# Patient Record
Sex: Male | Born: 1995 | Race: White | Hispanic: No | Marital: Single
Health system: Southern US, Community
[De-identification: ages and names within clinical notes are randomized; demographics above are authoritative.]

## PROBLEM LIST (undated history)

## (undated) DIAGNOSIS — R569 Unspecified convulsions: Secondary | ICD-10-CM

## (undated) DIAGNOSIS — F909 Attention-deficit hyperactivity disorder, unspecified type: Secondary | ICD-10-CM

## (undated) DIAGNOSIS — F319 Bipolar disorder, unspecified: Secondary | ICD-10-CM

## (undated) HISTORY — PX: APPENDECTOMY: SHX54

## (undated) HISTORY — PX: HERNIA REPAIR: SHX51

---
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
## 2014-10-14 ENCOUNTER — Emergency Department (HOSPITAL_COMMUNITY)
Admission: EM | Admit: 2014-10-14 | Discharge: 2014-10-14 | Disposition: A | Discharge status: Home / Self Care | Payer: Self-pay | Attending: Emergency Medicine | Admitting: Emergency Medicine

## 2014-10-14 ENCOUNTER — Encounter (HOSPITAL_COMMUNITY): Payer: Self-pay | Admitting: Emergency Medicine

## 2014-10-14 DIAGNOSIS — K0889 Other specified disorders of teeth and supporting structures: Secondary | ICD-10-CM

## 2014-10-14 DIAGNOSIS — K088 Other specified disorders of teeth and supporting structures: Secondary | ICD-10-CM | POA: Insufficient documentation

## 2014-10-14 DIAGNOSIS — K029 Dental caries, unspecified: Secondary | ICD-10-CM | POA: Insufficient documentation

## 2014-10-14 DIAGNOSIS — Z792 Long term (current) use of antibiotics: Secondary | ICD-10-CM | POA: Insufficient documentation

## 2014-10-14 DIAGNOSIS — Z720 Tobacco use: Secondary | ICD-10-CM | POA: Insufficient documentation

## 2014-10-14 DIAGNOSIS — Z8659 Personal history of other mental and behavioral disorders: Secondary | ICD-10-CM | POA: Insufficient documentation

## 2014-10-14 DIAGNOSIS — K0381 Cracked tooth: Secondary | ICD-10-CM | POA: Insufficient documentation

## 2014-10-14 HISTORY — DX: Bipolar disorder, unspecified: F31.9

## 2014-10-14 HISTORY — DX: Attention-deficit hyperactivity disorder, unspecified type: F90.9

## 2014-10-14 MED ORDER — PENICILLIN V POTASSIUM 250 MG PO TABS: 250.0000 mg | ORAL_TABLET | Freq: Four times a day (QID) | ORAL | Status: AC

## 2014-10-14 MED ORDER — IBUPROFEN 800 MG PO TABS: 800.0000 mg | ORAL_TABLET | Freq: Three times a day (TID) | ORAL | Status: DC

## 2014-10-14 NOTE — ED Provider Notes (Signed)
CSN: [id number]     Arrival date & time 10/14/14  1106 History  This chart was scribed for non-physician practitioner, Aaron LowerVrinda Bailen Holder, Aaron Holder, working with Aaron OharaJoshua Zavitz, MD by Aaron Holder, ED Scribe. This patient was seen in room TR05C/TR05C and the patient's care was started at 11:30 AM.   Chief Complaint  Patient presents with  . Dental Pain   The history is provided by the patient. No language interpreter was used.   HPI Comments: Aaron Holder is a 19 y.o. male, with a h/o bipolar disorder, who presents to the Emergency Department complaining of constant R upper and Holder dental pain for the past 2 weeks. Pt states that he has a dental [place] in [place] in 3 weeks. No other complaints at this time.   Past Medical History  Diagnosis Date  . Bipolar 1 disorder   . ADHD (attention deficit hyperactivity disorder)    Past Surgical History  Procedure Laterality Date  . Appendectomy    . Hernia repair     No family history on file. History  Substance Use Topics  . Smoking status: Current Every Day Smoker  . Smokeless tobacco: Not on file  . Alcohol Use: No    Review of Systems  HENT: Positive for dental problem.    Allergies  Review of patient's allergies indicates no known allergies.  Home Medications   Prior to Admission medications   Medication Sig Start Date End Date Taking? Authorizing Provider  HYDROcodone-acetaminophen (NORCO) 5-325 MG per tablet Take 1 tablet by mouth every 4 (four) hours as needed for pain. 11/04/12   Aaron AndrewPeter Dammen, PA-C  penicillin v potassium (VEETID) 500 MG tablet Take 1 tablet (500 mg total) by mouth 3 (three) times daily. 11/04/12   Aaron Dammen, PA-C   BP 131/50 mmHg  Pulse 65  Temp(Src) 97.9 F (36.6 C) (Oral)  SpO2 100% Physical Exam  Constitutional: He is oriented to person, place, and time. He appears well-developed and well-nourished. No distress.  HENT:  Head: Normocephalic and atraumatic.  Mouth/Throat:    Decayed and cracked  teeth  Eyes: Conjunctivae and EOM are normal.  Neck: Neck supple. No tracheal deviation present.  Cardiovascular: Normal rate.   Pulmonary/Chest: Effort normal. No respiratory distress.  Musculoskeletal: Normal range of motion.  Neurological: He is alert and oriented to person, place, and time.  Skin: Skin is warm and dry.  Psychiatric: He has a normal mood and affect. His behavior is normal.  Nursing note and vitals reviewed.   ED Course  Procedures (including critical care time) DIAGNOSTIC STUDIES: Oxygen Saturation is 100% on RA, normal by my interpretation.    COORDINATION OF CARE: 11:31 AM-Discussed treatment plan which includes Veetid, ibuprofen and follow-up with dentist with pt at bedside and pt agreed to plan.   Labs Review Labs Reviewed - No data to display  Imaging Review No results found.   EKG Interpretation None      MDM   Final diagnoses:  Toothache    Given pcn and ibuprofen. No oral swelling  I personally performed the services described in this documentation, which was scribed in my presence. The recorded information has been reviewed and is accurate.    Aaron Holder, Aaron Holder 10/14/14 1136  Aaron OharaJoshua Zavitz, MD 10/14/14 [phone]

## 2014-10-14 NOTE — Discharge Instructions (Signed)

## 2014-10-14 NOTE — ED Notes (Signed)
Pt c/o dental pain x 3 weeks. States has appointment with dentist in [place] next month. Here today for pain.

## 2015-07-17 ENCOUNTER — Emergency Department (HOSPITAL_COMMUNITY)
Admission: EM | Admit: 2015-07-17 | Discharge: 2015-07-17 | Discharge status: Absent without leave | Payer: Self-pay | Source: Home / Self Care

## 2016-02-23 ENCOUNTER — Emergency Department (HOSPITAL_COMMUNITY)
Admission: EM | Admit: 2016-02-23 | Discharge: 2016-02-23 | Disposition: A | Discharge status: Home / Self Care | Payer: Self-pay | Attending: Emergency Medicine | Admitting: Emergency Medicine

## 2016-02-23 ENCOUNTER — Encounter (HOSPITAL_COMMUNITY): Payer: Self-pay | Admitting: *Deleted

## 2016-02-23 DIAGNOSIS — Y92240 Courthouse as the place of occurrence of the external cause: Secondary | ICD-10-CM | POA: Insufficient documentation

## 2016-02-23 DIAGNOSIS — Y999 Unspecified external cause status: Secondary | ICD-10-CM | POA: Insufficient documentation

## 2016-02-23 DIAGNOSIS — S93402A Sprain of unspecified ligament of left ankle, initial encounter: Secondary | ICD-10-CM | POA: Insufficient documentation

## 2016-02-23 DIAGNOSIS — Z79899 Other long term (current) drug therapy: Secondary | ICD-10-CM | POA: Insufficient documentation

## 2016-02-23 DIAGNOSIS — Y9301 Activity, walking, marching and hiking: Secondary | ICD-10-CM | POA: Insufficient documentation

## 2016-02-23 DIAGNOSIS — F17200 Nicotine dependence, unspecified, uncomplicated: Secondary | ICD-10-CM | POA: Insufficient documentation

## 2016-02-23 DIAGNOSIS — W19XXXA Unspecified fall, initial encounter: Secondary | ICD-10-CM | POA: Insufficient documentation

## 2016-02-23 MED ORDER — IBUPROFEN 800 MG PO TABS
800.0000 mg | ORAL_TABLET | Freq: Once | ORAL | Status: AC
  Administered 2016-02-23: 800 mg via ORAL
  Filled 2016-02-23: qty 1

## 2016-02-23 MED ORDER — IBUPROFEN 800 MG PO TABS: 800.0000 mg | ORAL_TABLET | Freq: Three times a day (TID) | ORAL | 0 refills | Status: AC

## 2016-02-23 NOTE — Progress Notes (Signed)
Orthopedic Tech Progress Note Patient Details:  Aaron Holder 11-16-1995 [id number]  Ortho Devices Type of Ortho Device: ASO Ortho Device/Splint Location: lle Ortho Device/Splint Interventions: Application   [name] 02/23/2016, 4:45 PM

## 2016-02-23 NOTE — ED Triage Notes (Signed)
Pt reports left leg swelling for over the past year. Reports increase in pain and redness. Had fall yesterday due to pain. No acute distress noted at triage.

## 2016-02-23 NOTE — ED Provider Notes (Signed)
[facility]EM[facility]ENCY DEPT Provider Note   CSN: [id number] Arrival date & time: 02/23/16  1132  First Provider Contact:  First MD Initiated Contact with Patient 02/23/16 1529     History   Chief Complaint Chief Complaint  Patient presents with  . Leg Pain    HPI Aaron Holder is a 20 y.o. male.  The history is provided by the patient. No language interpreter was used.  Leg Pain   This is a recurrent problem. The current episode started 12 to 24 hours ago. The problem occurs constantly. The problem has been resolved. The pain is present in the left ankle. The quality of the pain is described as aching. The pain is at a severity of 3/10. The pain is mild. Associated symptoms include limited range of motion. Pertinent negatives include no numbness, no stiffness, no tingling and no itching. He has tried rest for the symptoms. The treatment provided mild relief. There has been a history of trauma.    Past Medical History:  Diagnosis Date  . ADHD (attention deficit hyperactivity disorder)   . Bipolar 1 disorder (HCC)     There are no active problems to display for this patient.   Past Surgical History:  Procedure Laterality Date  . APPENDECTOMY    . HERNIA REPAIR         Home Medications    Prior to Admission medications   Medication Sig Start Date End Date Taking? Authorizing Provider  ibuprofen (ADVIL,MOTRIN) 800 MG tablet Take 1 tablet (800 mg total) by mouth 3 (three) times daily. 02/23/16   Dan Humphreys, MD    Family History History reviewed. No pertinent family history.  Social History Social History  Substance Use Topics  . Smoking status: Current Every Day Smoker  . Smokeless tobacco: Not on file  . Alcohol use No     Allergies   Review of patient's allergies indicates no known allergies.   Review of Systems Review of Systems  Constitutional: Negative for activity change, appetite change, chills, diaphoresis, fatigue, fever and unexpected weight change.   HENT: Negative for congestion and dental problem.   Eyes: Negative for pain, discharge and itching.  Respiratory: Negative for apnea and chest tightness.   Cardiovascular: Negative for chest pain and leg swelling.  Gastrointestinal: Negative for abdominal distention, abdominal pain and anal bleeding.  Genitourinary: Negative for difficulty urinating, dysuria, enuresis and flank pain.  Musculoskeletal: Positive for joint swelling. Negative for arthralgias, back pain, gait problem, myalgias, neck pain, neck stiffness and stiffness.  Skin: Negative for color change, itching and pallor.  Neurological: Negative for tingling and numbness.  Psychiatric/Behavioral: Negative for agitation and behavioral problems.  All other systems reviewed and are negative.    Physical Exam Updated Vital Signs BP 132/71   Pulse 75   Temp 98.5 F (36.9 C) (Oral)   Resp 16   SpO2 98%   Physical Exam  Constitutional: He is oriented to person, place, and time. He appears well-developed and well-nourished. No distress.  HENT:  Head: Normocephalic and atraumatic.  Eyes: EOM are normal. Pupils are equal, round, and reactive to light.  Neck: Normal range of motion. Neck supple.  Cardiovascular: Normal rate, regular rhythm, normal heart sounds and intact distal pulses.   Pulmonary/Chest: Effort normal.  Abdominal: Soft. Bowel sounds are normal.  Musculoskeletal:       Left ankle: He exhibits decreased range of motion and swelling. He exhibits no ecchymosis, no deformity, no laceration and normal pulse. No tenderness. No lateral  malleolus and no medial malleolus tenderness found.  Neurological: He is alert and oriented to person, place, and time. He has normal reflexes. He displays normal reflexes. No cranial nerve deficit. He exhibits normal muscle tone. Coordination normal.  Skin: He is not diaphoretic.  Nursing note and vitals reviewed.    ED Treatments / Results  Labs (all labs ordered are listed, but  only abnormal results are displayed) Labs Reviewed - No data to display  EKG  EKG Interpretation None       Radiology No results found.  Procedures Procedures (including critical care time)  Medications Ordered in ED Medications  ibuprofen (ADVIL,MOTRIN) tablet 800 mg (800 mg Oral Given 02/23/16 1649)     Initial Impression / Assessment and Plan / ED Course  I have reviewed the triage vital signs and the nursing notes.  Pertinent labs & imaging results that were available during my care of the patient were reviewed by me and considered in my medical decision making (see chart for details).  Clinical Course   Patient is 20 year old gentleman who comes in for evaluation of left ankle and lower leg pain is exacerbated 2 days ago when he was [place]. He had an inversion ankle injury which is improved. He reports some mild swelling and pain in the lower tremors. He is a long history of lower leg pain since early childhood. He states that this is not new or different.  Physical exam patient normal vital signs. Alert, oriented in no acute distress. His mild swelling about the left ankle. He has normal sensation and pulses distally.  Differential diagnosis includes sprain versus fracture. No bony tenderness and patient declines x-ray at this time. Patient able to bear weight. Sprain much more likely.  Patient requests referral to orthopedics for long-term management and treatment of chronic left lower leg pain.  Provided ibuprofen, air splint, crutches in the emergency department and referral to primary care physician and orthopedics.  Patient ambulatory with crutches at time of discharge.  Work note provided at patient's request.  Discussed case my attending, Dr. Jeraldine Loots.  Final Clinical Impressions(s) / ED Diagnoses   Final diagnoses:  Ankle sprain, left, initial encounter    New Prescriptions Discharge Medication List as of 02/23/2016  4:35 PM         Dan Humphreys, MD 02/23/16 1703    Gerhard Munch, MD 02/23/16 2358

## 2016-03-18 ENCOUNTER — Emergency Department (HOSPITAL_COMMUNITY)
Admission: EM | Admit: 2016-03-18 | Discharge: 2016-03-18 | Disposition: A | Discharge status: Home / Self Care | Payer: Self-pay | Attending: Emergency Medicine | Admitting: Emergency Medicine

## 2016-03-18 ENCOUNTER — Encounter (HOSPITAL_COMMUNITY): Payer: Self-pay

## 2016-03-18 DIAGNOSIS — K0889 Other specified disorders of teeth and supporting structures: Secondary | ICD-10-CM | POA: Insufficient documentation

## 2016-03-18 DIAGNOSIS — F17200 Nicotine dependence, unspecified, uncomplicated: Secondary | ICD-10-CM | POA: Insufficient documentation

## 2016-03-18 DIAGNOSIS — F909 Attention-deficit hyperactivity disorder, unspecified type: Secondary | ICD-10-CM | POA: Insufficient documentation

## 2016-03-18 MED ORDER — PENICILLIN V POTASSIUM 500 MG PO TABS: 500.0000 mg | ORAL_TABLET | Freq: Three times a day (TID) | ORAL | 0 refills | Status: DC

## 2016-03-18 MED ORDER — OXYCODONE-ACETAMINOPHEN 5-325 MG PO TABS
1.0000 | ORAL_TABLET | Freq: Once | ORAL | Status: AC
  Administered 2016-03-18: 1 via ORAL
  Filled 2016-03-18: qty 1

## 2016-03-18 MED ORDER — NAPROXEN 500 MG PO TABS: 500.0000 mg | ORAL_TABLET | Freq: Two times a day (BID) | ORAL | 0 refills | Status: DC

## 2016-03-18 NOTE — ED Notes (Signed)
Pt denies any abdominal pain. Pt states nauseated due to pain.

## 2016-03-18 NOTE — ED Triage Notes (Signed)
Pt complaining of nausea, vomiting and dental pain. Pt states has open pocket in top right of mouth. Pt states ongoing x 2 days.

## 2016-03-18 NOTE — ED Provider Notes (Signed)
[facility] Provider Note   CSN: [id number] Arrival date & time: 03/18/16  1751  By signing my name below, I, Phillis HaggisGabriella Gaje, attest that this documentation has been prepared under the direction and in the presence of Felicie Mornavid Phyliss Hulick, NP-C. Electronically Signed: Phillis HaggisGabriella Gaje, ED Scribe. 03/18/16. 8:01 PM.  History   Chief Complaint Chief Complaint  Patient presents with  . Dental Pain   The history is provided by the patient. No language interpreter was used.  Dental Pain   This is a new problem. The current episode started 2 days ago. The problem occurs constantly. The problem has been gradually worsening. The pain is mild. Treatments tried: ibuprofen. The treatment provided no relief.  HPI Comments: Aaron Holder is a 20 y.o. male who presents to the Emergency Department complaining of gradually worsening right upper dental pain onset two days ago. Pt reports associated nausea and vomiting secondary to pain. Pt states that he has had an open pocket in the area for two years, but unknowingly messed with the area in his sleep, further agitating the gums. Pt states that the pain makes it hard to eat. Pt has been taking Ibuprofen for pain to no relief. He denies fever or chills.   Past Medical History:  Diagnosis Date  . ADHD (attention deficit hyperactivity disorder)   . Bipolar 1 disorder (HCC)     There are no active problems to display for this patient.   Past Surgical History:  Procedure Laterality Date  . APPENDECTOMY    . HERNIA REPAIR      Home Medications    Prior to Admission medications   Medication Sig Start Date End Date Taking? Authorizing Provider  ibuprofen (ADVIL,MOTRIN) 800 MG tablet Take 1 tablet (800 mg total) by mouth 3 (three) times daily. 02/23/16   Dan HumphreysMichael Irick, MD    Family History History reviewed. No pertinent family history.  Social History Social History  Substance Use Topics  . Smoking status: Current Every Day Smoker  .  Smokeless tobacco: Never Used  . Alcohol use No     Allergies   Review of patient's allergies indicates no known allergies.   Review of Systems Review of Systems  Constitutional: Negative for chills and fever.  HENT: Positive for dental problem.   Gastrointestinal: Positive for nausea and vomiting. Negative for abdominal pain.  All other systems reviewed and are negative.    Physical Exam Updated Vital Signs BP 120/80   Pulse 74   Temp 98.7 F (37.1 C)   Resp 18   Ht 5\' 7"  (1.702 m)   Wt 187 lb (84.8 kg)   SpO2 97%   BMI 29.29 kg/m   Physical Exam  Constitutional: He is oriented to person, place, and time. He appears well-developed and well-nourished.  HENT:  Head: Normocephalic and atraumatic.  Mouth/Throat: Uvula is midline, oropharynx is clear and moist and mucous membranes are normal. No trismus in the jaw.    Eyes: Conjunctivae and EOM are normal. Pupils are equal, round, and reactive to light.  Neck: Normal range of motion. Neck supple.  Cardiovascular: Normal rate and regular rhythm.   Pulmonary/Chest: Effort normal.  Musculoskeletal: Normal range of motion.  Neurological: He is alert and oriented to person, place, and time.  Skin: Skin is warm and dry.  Psychiatric: He has a normal mood and affect. His behavior is normal.  Nursing note and vitals reviewed.    ED Treatments / Results  DIAGNOSTIC STUDIES: Oxygen Saturation is 97% on  RA, normal by my interpretation.    COORDINATION OF CARE: 7:59 PM-Discussed treatment plan which includes antibiotics and anti-inflammatories with pt at bedside and pt agreed to plan.   Labs (all labs ordered are listed, but only abnormal results are displayed) Labs Reviewed - No data to display  EKG  EKG Interpretation None       Radiology No results found.  Procedures Procedures (including critical care time)  Medications Ordered in ED Medications - No data to display   Initial Impression / Assessment  and Plan / ED Course  I have reviewed the triage vital signs and the nursing notes.  Pertinent labs & imaging results that were available during my care of the patient were reviewed by me and considered in my medical decision making (see chart for details).  Clinical Course      Final Clinical Impressions(s) / ED Diagnoses  Dental pain. Patient with toothache.  No gross abscess.  Exam unconcerning for Ludwig's angina or spread of infection.  Will treat with penicillin and pain medicine.  Urged patient to follow-up with dentist.    Final diagnoses:  None  I personally performed the services described in this documentation, which was scribed in my presence. The recorded information has been reviewed and is accurate.    New Prescriptions New Prescriptions   No medications on file     Felicie MornDavid Steffie Waggoner, NP 03/18/16 [phone]    Nelva Nayobert Beaton, MD 03/20/16 1346

## 2017-03-20 ENCOUNTER — Emergency Department (HOSPITAL_COMMUNITY)
Admission: EM | Admit: 2017-03-20 | Discharge: 2017-03-20 | Disposition: A | Discharge status: Home / Self Care | Payer: Self-pay | Attending: Emergency Medicine | Admitting: Emergency Medicine

## 2017-03-20 ENCOUNTER — Encounter (HOSPITAL_COMMUNITY): Payer: Self-pay | Admitting: Emergency Medicine

## 2017-03-20 DIAGNOSIS — F17200 Nicotine dependence, unspecified, uncomplicated: Secondary | ICD-10-CM | POA: Insufficient documentation

## 2017-03-20 DIAGNOSIS — Z79899 Other long term (current) drug therapy: Secondary | ICD-10-CM | POA: Insufficient documentation

## 2017-03-20 DIAGNOSIS — L0201 Cutaneous abscess of face: Secondary | ICD-10-CM | POA: Insufficient documentation

## 2017-03-20 DIAGNOSIS — F909 Attention-deficit hyperactivity disorder, unspecified type: Secondary | ICD-10-CM | POA: Insufficient documentation

## 2017-03-20 MED ORDER — LIDOCAINE-EPINEPHRINE-TETRACAINE (LET) SOLUTION
3.0000 mL | Freq: Once | NASAL | Status: AC
  Administered 2017-03-20: 3 mL via TOPICAL
  Filled 2017-03-20: qty 3

## 2017-03-20 MED ORDER — MUPIROCIN CALCIUM 2 % NA OINT: TOPICAL_OINTMENT | NASAL | 0 refills | Status: AC

## 2017-03-20 MED ORDER — LIDOCAINE HCL (PF) 1 % IJ SOLN
2.0000 mL | Freq: Once | INTRAMUSCULAR | Status: AC
  Administered 2017-03-20: 2 mL
  Filled 2017-03-20: qty 5

## 2017-03-20 MED ORDER — CEPHALEXIN 500 MG PO CAPS
500.0000 mg | ORAL_CAPSULE | Freq: Four times a day (QID) | ORAL | 0 refills | Status: DC
Start: 2017-03-20 — End: 2021-09-10

## 2017-03-20 MED ORDER — CEPHALEXIN 500 MG PO CAPS
500.0000 mg | ORAL_CAPSULE | Freq: Once | ORAL | Status: AC
  Administered 2017-03-20: 500 mg via ORAL
  Filled 2017-03-20: qty 1

## 2017-03-20 MED ORDER — HYDROCODONE-ACETAMINOPHEN 5-325 MG PO TABS
1.0000 | ORAL_TABLET | Freq: Once | ORAL | Status: AC
  Administered 2017-03-20: 1 via ORAL
  Filled 2017-03-20: qty 1

## 2017-03-20 MED ORDER — HYDROCODONE-ACETAMINOPHEN 5-325 MG PO TABS: 1.0000 | ORAL_TABLET | Freq: Four times a day (QID) | ORAL | 0 refills | Status: DC | PRN

## 2017-03-20 MED ORDER — MUPIROCIN CALCIUM 2 % EX CREA
TOPICAL_CREAM | Freq: Two times a day (BID) | CUTANEOUS | Status: DC
  Filled 2017-03-20: qty 15

## 2017-03-20 NOTE — ED Triage Notes (Signed)
Pt c/o sore to left side of nose beginning yesterday. Pt stated the sore has gotten bigger and the pain is worse. Does [employer] work, denies drug use.

## 2017-03-20 NOTE — Discharge Instructions (Signed)
Apply warm compress to the area 3-4 times a day.  Mr. Aaron Holder ointment twice a day to each near take the antibiotic as directed until all tablets have been completed.  I would like to see you back in 2 days for a wound recheck, sooner if symptoms are getting worse

## 2017-03-20 NOTE — ED Provider Notes (Signed)
[facility]-EMERGENCY DEPT Provider Note   CSN: [id number] Arrival date & time: 03/20/17  0115     History   Chief Complaint Chief Complaint  Patient presents with  . Facial Swelling    HPI Aaron Holder is a 21 y.o. male.  Patient noticed a sore at the base of the left near 2 days ago, has progressively gotten more swollen and painful with surrounding erythema.  He's been trying warm compresses and Tylenol without relief      Past Medical History:  Diagnosis Date  . ADHD (attention deficit hyperactivity disorder)   . Bipolar 1 disorder (HCC)     There are no active problems to display for this patient.   Past Surgical History:  Procedure Laterality Date  . APPENDECTOMY    . HERNIA REPAIR         Home Medications    Prior to Admission medications   Medication Sig Start Date End Date Taking? Authorizing Provider  ibuprofen (ADVIL,MOTRIN) 800 MG tablet Take 1 tablet (800 mg total) by mouth 3 (three) times daily. 02/23/16   Dan Humphreys, MD  naproxen (NAPROSYN) 500 MG tablet Take 1 tablet (500 mg total) by mouth 2 (two) times daily. 03/18/16   Felicie Morn, NP  penicillin v potassium (VEETID) 500 MG tablet Take 1 tablet (500 mg total) by mouth 3 (three) times daily. 03/18/16   Felicie Morn, NP    Family History No family history on file.  Social History Social History  Substance Use Topics  . Smoking status: Current Every Day Smoker  . Smokeless tobacco: Never Used  . Alcohol use No     Allergies   Patient has no known allergies.   Review of Systems Review of Systems  Constitutional: Negative for chills and fever.  HENT: Positive for facial swelling. Negative for congestion, nosebleeds, postnasal drip, rhinorrhea and sneezing.   Eyes: Negative for visual disturbance.  Neurological: Negative for dizziness and headaches.  All other systems reviewed and are negative.    Physical Exam Updated Vital Signs BP (!) 145/94 (BP Location: Right Arm)    Pulse (!) 56   Temp 98.4 F (36.9 C) (Oral)   Resp 18   SpO2 100%   Physical Exam  Constitutional: He appears well-developed and well-nourished.  HENT:  Head: Normocephalic.  Nose:    Eyes: Pupils are equal, round, and reactive to light.  Neck: Normal range of motion.  Cardiovascular: Normal rate.   Pulmonary/Chest: Effort normal.  Musculoskeletal: Normal range of motion.  Neurological: He is alert.  Skin: Skin is warm. There is erythema.  Psychiatric: He has a normal mood and affect.  Nursing note and vitals reviewed.    ED Treatments / Results  Labs (all labs ordered are listed, but only abnormal results are displayed) Labs Reviewed - No data to display  EKG  EKG Interpretation None       Radiology No results found.  Procedures .Marland KitchenIncision and Drainage Date/Time: 03/20/2017 4:52 AM Performed by: Earley Favor Authorized by: Earley Favor   Consent:    Consent obtained:  Verbal   Consent given by:  Patient   Risks discussed:  Bleeding and damage to other organs   Alternatives discussed:  No treatment Location:    Type:  Abscess   Size:  1   Location:  Head   Head location:  Nose Pre-procedure details:    Skin preparation:  Antiseptic wash Procedure type:    Complexity:  Simple Procedure details:    Needle  aspiration: no     Incision types:  Stab incision   Incision depth:  Dermal   Scalpel blade:  11   Drainage:  Purulent   Drainage amount:  Scant   Wound treatment:  Wound left open   Packing materials:  None Post-procedure details:    Patient tolerance of procedure:  Tolerated well, no immediate complications   (including critical care time)  Medications Ordered in ED Medications  lidocaine-EPINEPHrine-tetracaine (LET) solution (not administered)  lidocaine (PF) (XYLOCAINE) 1 % injection 2 mL (not administered)     Initial Impression / Assessment and Plan / ED Course  I have reviewed the triage vital signs and the nursing  notes.  Pertinent labs & imaging results that were available during my care of the patient were reviewed by me and considered in my medical decision making (see chart for details).     Will start patinet on warm soaks, PO antibiotics, topical Bactroban and follow up 2 day in ED fro recheck   Final Clinical Impressions(s) / ED Diagnoses   Final diagnoses:  None    New Prescriptions New Prescriptions   No medications on file     Earley Favor, NP 03/20/17 0502    Gilda Crease, MD 03/20/17 [id number]

## 2019-11-19 ENCOUNTER — Emergency Department (HOSPITAL_COMMUNITY)
Admission: EM | Admit: 2019-11-19 | Discharge: 2019-11-19 | Discharge status: Against Medical Advice | Payer: No Typology Code available for payment source | Attending: Emergency Medicine | Admitting: Emergency Medicine

## 2019-11-19 ENCOUNTER — Encounter (HOSPITAL_COMMUNITY): Payer: Self-pay | Admitting: Emergency Medicine

## 2019-11-19 ENCOUNTER — Emergency Department (HOSPITAL_COMMUNITY): Payer: No Typology Code available for payment source

## 2019-11-19 DIAGNOSIS — S70211A Abrasion, right hip, initial encounter: Secondary | ICD-10-CM | POA: Diagnosis not present

## 2019-11-19 DIAGNOSIS — Z5320 Procedure and treatment not carried out because of patient's decision for unspecified reasons: Secondary | ICD-10-CM | POA: Diagnosis not present

## 2019-11-19 DIAGNOSIS — R079 Chest pain, unspecified: Secondary | ICD-10-CM | POA: Diagnosis not present

## 2019-11-19 DIAGNOSIS — Y92410 Unspecified street and highway as the place of occurrence of the external cause: Secondary | ICD-10-CM | POA: Diagnosis not present

## 2019-11-19 DIAGNOSIS — Y998 Other external cause status: Secondary | ICD-10-CM | POA: Insufficient documentation

## 2019-11-19 DIAGNOSIS — Y9389 Activity, other specified: Secondary | ICD-10-CM | POA: Diagnosis not present

## 2019-11-19 DIAGNOSIS — W2210XA Striking against or struck by unspecified automobile airbag, initial encounter: Secondary | ICD-10-CM | POA: Diagnosis not present

## 2019-11-19 DIAGNOSIS — S40011A Contusion of right shoulder, initial encounter: Secondary | ICD-10-CM | POA: Diagnosis not present

## 2019-11-19 DIAGNOSIS — F17200 Nicotine dependence, unspecified, uncomplicated: Secondary | ICD-10-CM | POA: Diagnosis not present

## 2019-11-19 DIAGNOSIS — S79911A Unspecified injury of right hip, initial encounter: Secondary | ICD-10-CM | POA: Diagnosis present

## 2019-11-19 LAB — COMPREHENSIVE METABOLIC PANEL
ALT: 44 U/L (ref 0–44)
AST: 57 U/L — ABNORMAL HIGH (ref 15–41)
Albumin: 3.6 g/dL (ref 3.5–5.0)
Alkaline Phosphatase: 127 U/L — ABNORMAL HIGH (ref 38–126)
Anion gap: 14 (ref 5–15)
BUN: 15 mg/dL (ref 6–20)
CO2: 26 mmol/L (ref 22–32)
Calcium: 9.6 mg/dL (ref 8.9–10.3)
Chloride: 96 mmol/L — ABNORMAL LOW (ref 98–111)
Creatinine, Ser: 1.05 mg/dL (ref 0.61–1.24)
GFR calc Af Amer: >60 mL/min (ref >60)
GFR calc non Af Amer: >60 mL/min (ref >60)
Glucose, Bld: 119 mg/dL — ABNORMAL HIGH (ref 70–99)
Potassium: 4.5 mmol/L (ref 3.5–5.1)
Sodium: 136 mmol/L (ref 135–145)
Total Bilirubin: 0.3 mg/dL (ref 0.3–1.2)
Total Protein: 7.7 g/dL (ref 6.5–8.1)

## 2019-11-19 LAB — CBC
HCT: 40.1 % (ref 39.0–52.0)
Hemoglobin: 13.3 g/dL (ref 13.0–17.0)
MCH: 28.8 pg (ref 26.0–34.0)
MCHC: 33.2 g/dL (ref 30.0–36.0)
MCV: 86.8 fL (ref 80.0–100.0)
Platelets: 233 10*3/uL (ref 150–400)
RBC: 4.62 MIL/uL (ref 4.22–5.81)
RDW: 13.3 % (ref 11.5–15.5)
WBC: 13.8 10*3/uL — ABNORMAL HIGH (ref 4.0–10.5)
nRBC: 0 % (ref 0.0–0.2)

## 2019-11-19 LAB — SAMPLE TO BLOOD BANK

## 2019-11-19 LAB — PROTIME-INR
INR: 1 (ref 0.8–1.2)
Prothrombin Time: 13.5 seconds (ref 11.4–15.2)

## 2019-11-19 LAB — LACTIC ACID, PLASMA: Lactic Acid, Venous: 2.7 mmol/L (ref 0.5–1.9)
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
## 2019-11-19 NOTE — ED Notes (Signed)
1L warm NS infusing

## 2019-11-19 NOTE — ED Notes (Signed)
Pt insist on putting on his jeans and ambulating to BR.
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
## 2019-11-19 NOTE — ED Notes (Signed)
Radiology at bedside

## 2019-11-19 NOTE — ED Notes (Signed)
Pt refused CT's, pt refusing to wait for any medications or further treatment, Dr. Nada Boozer in to speak with patient. Pt continues to pace around room with family attempting to convince patient to stay.
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
## 2019-11-19 NOTE — ED Notes (Signed)
Pt ambulatory to WR with family, pt explained in detail potential consequences of leaving without further testing. Pt ambulated to WR with steady gait, NAD.

## 2019-11-19 NOTE — ED Notes (Signed)
Visitor at bedside, talking on telephone.
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
## 2019-11-22 ENCOUNTER — Encounter (HOSPITAL_COMMUNITY): Payer: Self-pay | Admitting: Emergency Medicine

## 2020-04-29 ENCOUNTER — Emergency Department (HOSPITAL_COMMUNITY): Payer: Self-pay

## 2020-04-29 ENCOUNTER — Emergency Department (HOSPITAL_COMMUNITY)
Admission: EM | Admit: 2020-04-29 | Discharge: 2020-04-30 | Disposition: A | Discharge status: Home / Self Care | Payer: Self-pay | Attending: Emergency Medicine | Admitting: Emergency Medicine

## 2020-04-29 ENCOUNTER — Encounter (HOSPITAL_COMMUNITY): Payer: Self-pay | Admitting: Emergency Medicine

## 2020-04-29 ENCOUNTER — Other Ambulatory Visit: Payer: Self-pay

## 2020-04-29 DIAGNOSIS — S22059A Unspecified fracture of T5-T6 vertebra, initial encounter for closed fracture: Secondary | ICD-10-CM | POA: Insufficient documentation

## 2020-04-29 DIAGNOSIS — S22029A Unspecified fracture of second thoracic vertebra, initial encounter for closed fracture: Secondary | ICD-10-CM | POA: Insufficient documentation

## 2020-04-29 DIAGNOSIS — S22049A Unspecified fracture of fourth thoracic vertebra, initial encounter for closed fracture: Secondary | ICD-10-CM | POA: Insufficient documentation

## 2020-04-29 DIAGNOSIS — S22020A Wedge compression fracture of second thoracic vertebra, initial encounter for closed fracture: Secondary | ICD-10-CM

## 2020-04-29 DIAGNOSIS — S22039A Unspecified fracture of third thoracic vertebra, initial encounter for closed fracture: Secondary | ICD-10-CM | POA: Insufficient documentation

## 2020-04-29 DIAGNOSIS — W19XXXA Unspecified fall, initial encounter: Secondary | ICD-10-CM

## 2020-04-29 DIAGNOSIS — W182XXA Fall in (into) shower or empty bathtub, initial encounter: Secondary | ICD-10-CM | POA: Insufficient documentation

## 2020-04-29 DIAGNOSIS — R200 Anesthesia of skin: Secondary | ICD-10-CM | POA: Insufficient documentation

## 2020-04-29 DIAGNOSIS — Y92148 Other place in prison as the place of occurrence of the external cause: Secondary | ICD-10-CM | POA: Insufficient documentation

## 2020-04-29 HISTORY — DX: Unspecified convulsions: R56.9

## 2020-04-29 LAB — COMPREHENSIVE METABOLIC PANEL
ALT: 11 U/L (ref 0–44)
AST: 15 U/L (ref 15–41)
Albumin: 3.5 g/dL (ref 3.5–5.0)
Alkaline Phosphatase: 63 U/L (ref 38–126)
Anion gap: 9 (ref 5–15)
BUN: 10 mg/dL (ref 6–20)
CO2: 26 mmol/L (ref 22–32)
Calcium: 9 mg/dL (ref 8.9–10.3)
Chloride: 106 mmol/L (ref 98–111)
Creatinine, Ser: 0.8 mg/dL (ref 0.61–1.24)
GFR calc Af Amer: >60 mL/min (ref >60)
GFR calc non Af Amer: >60 mL/min (ref >60)
Glucose, Bld: 107 mg/dL — ABNORMAL HIGH (ref 70–99)
Potassium: 3.5 mmol/L (ref 3.5–5.1)
Sodium: 141 mmol/L (ref 135–145)
Total Bilirubin: 0.4 mg/dL (ref 0.3–1.2)
Total Protein: 6.9 g/dL (ref 6.5–8.1)

## 2020-04-29 LAB — I-STAT CHEM 8, ED
BUN: 12 mg/dL (ref 6–20)
Calcium, Ion: 1.05 mmol/L — ABNORMAL LOW (ref 1.15–1.40)
Chloride: 106 mmol/L (ref 98–111)
Creatinine, Ser: 0.7 mg/dL (ref 0.61–1.24)
Glucose, Bld: 104 mg/dL — ABNORMAL HIGH (ref 70–99)
HCT: 38 % — ABNORMAL LOW (ref 39.0–52.0)
Hemoglobin: 12.9 g/dL — ABNORMAL LOW (ref 13.0–17.0)
Potassium: 3.6 mmol/L (ref 3.5–5.1)
Sodium: 142 mmol/L (ref 135–145)
TCO2: 24 mmol/L (ref 22–32)

## 2020-04-29 LAB — PROTIME-INR
INR: 1 (ref 0.8–1.2)
Prothrombin Time: 12.9 seconds (ref 11.4–15.2)

## 2020-04-29 LAB — LACTIC ACID, PLASMA: Lactic Acid, Venous: 1.2 mmol/L (ref 0.5–1.9)

## 2020-04-29 LAB — CBC
HCT: 39.7 % (ref 39.0–52.0)
Hemoglobin: 13.1 g/dL (ref 13.0–17.0)
MCH: 28.7 pg (ref 26.0–34.0)
MCHC: 33 g/dL (ref 30.0–36.0)
MCV: 86.9 fL (ref 80.0–100.0)
Platelets: 269 10*3/uL (ref 150–400)
RBC: 4.57 MIL/uL (ref 4.22–5.81)
RDW: 13.3 % (ref 11.5–15.5)
WBC: 8.6 10*3/uL (ref 4.0–10.5)
nRBC: 0 % (ref 0.0–0.2)

## 2020-04-29 LAB — ETHANOL: Alcohol, Ethyl (B): <10 mg/dL (ref <10)

## 2020-04-29 LAB — SAMPLE TO BLOOD BANK

## 2020-04-29 IMAGING — CT CT L SPINE W/O CM
3 of 4 series · 13 of 33 positions shown, 16 images · non-contrast
Comparison: Thoracic spine CT today reported separately. CT Abdomen
and Pelvis [DATE].

CLINICAL DATA: 24-year-old male status post fall with back pain.

EXAM:
CT LUMBAR SPINE WITHOUT CONTRAST
TECHNIQUE: Multidetector CT imaging of the lumbar spine was performed without
intravenous contrast administration. Multiplanar CT image
reconstructions were also generated.

[Series 4: l-spine 2.0 st · axial · 0.45mm/px · z∈[-856,-580]mm · 5 of 184 slices shown, 7 images]
[im 23/184  soft-tissue]
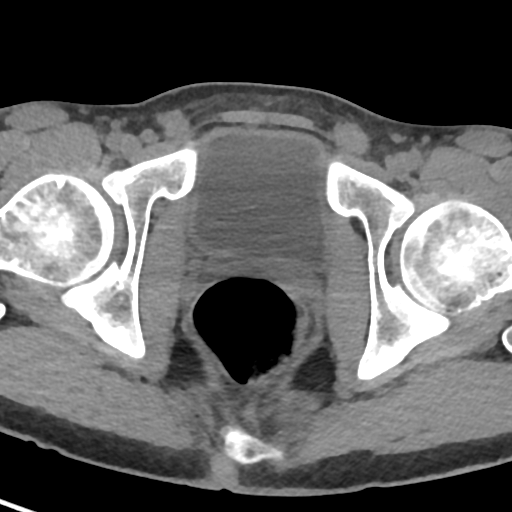
[im 23/184  bone]
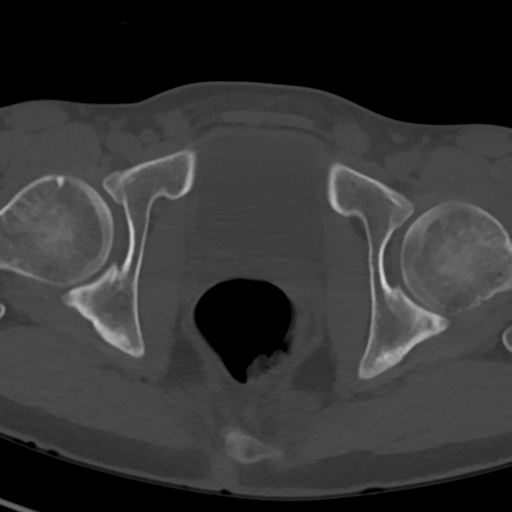
[im 69/184  bone]
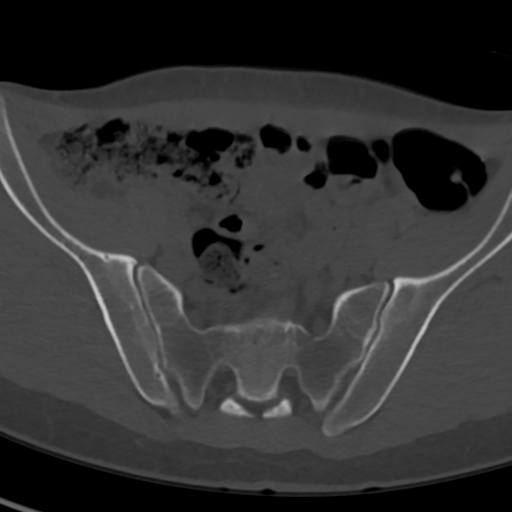
[im 92/184  bone]
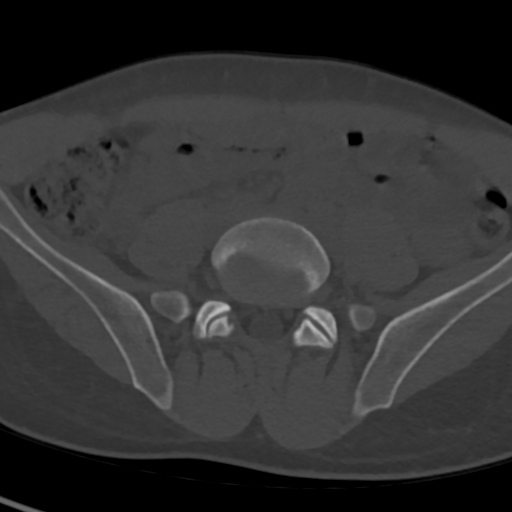
[im 115/184  bone]
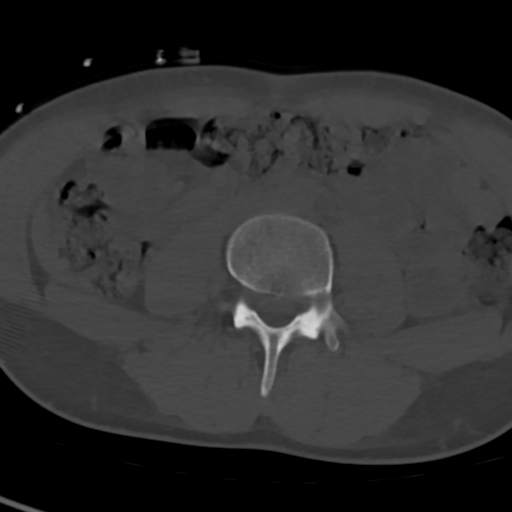
[im 161/184  soft-tissue]
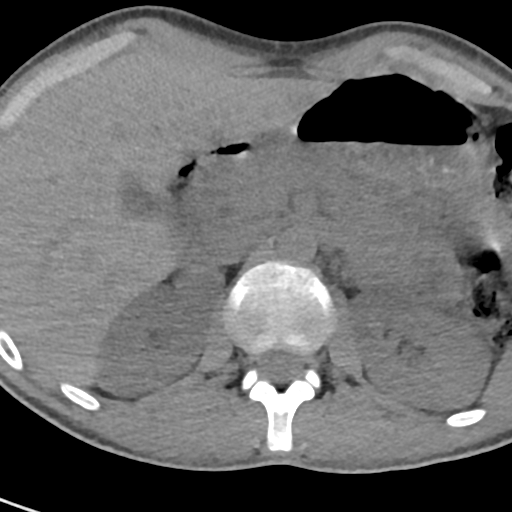
[im 161/184  bone]
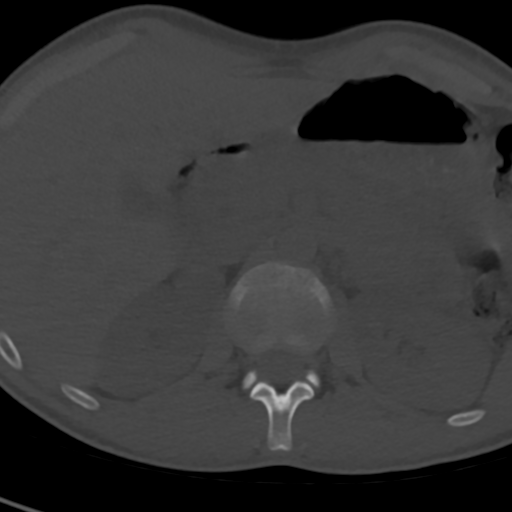

[Series 6: l-spine 2.0 cor bone · coronal · 0.44mm/px · 3 of 81 slices shown]
[im 17/81  bone]
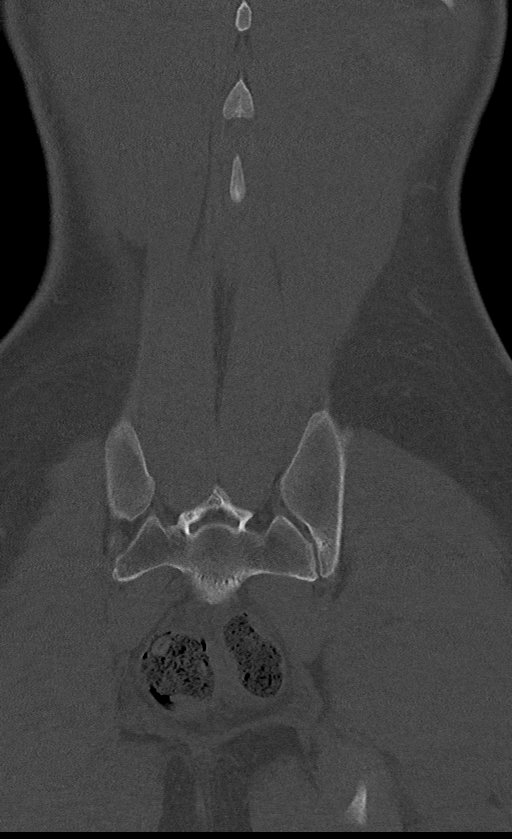
[im 33/81  bone]
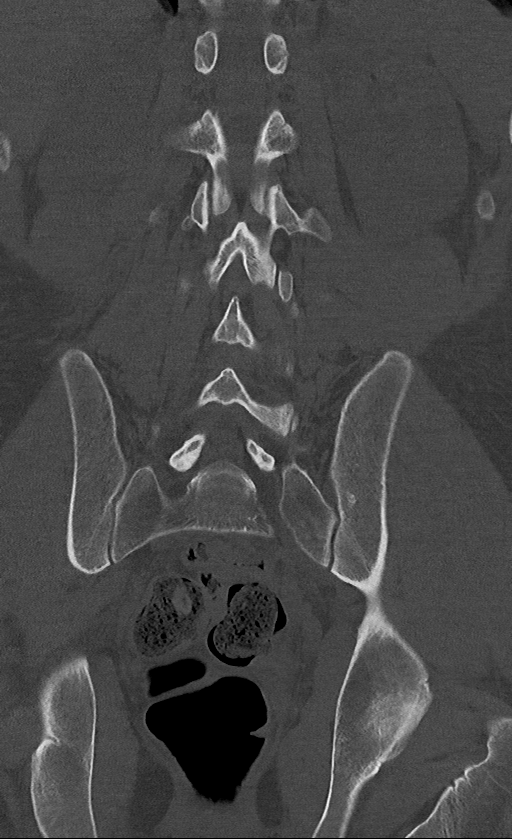
[im 49/81  bone]
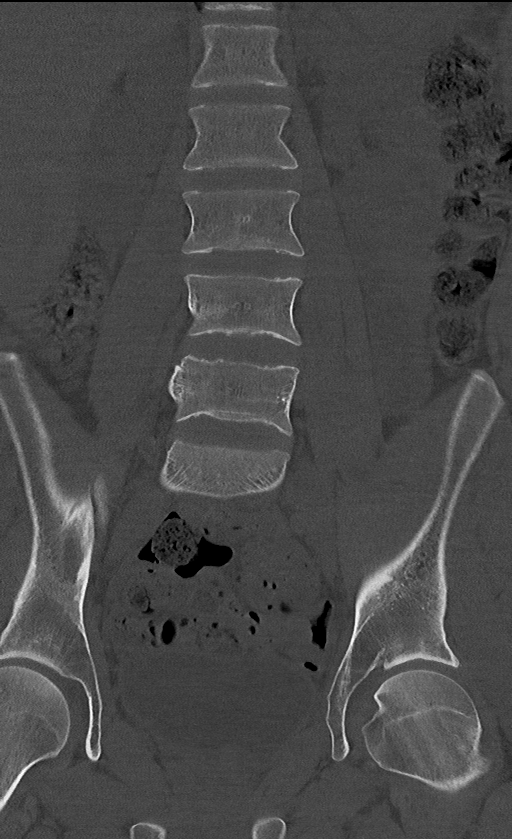

[Series 7: l-spine 2.0 sag bone · sagittal · 0.44mm/px · 5 of 73 slices shown, 6 images]
[im 25/73  bone]
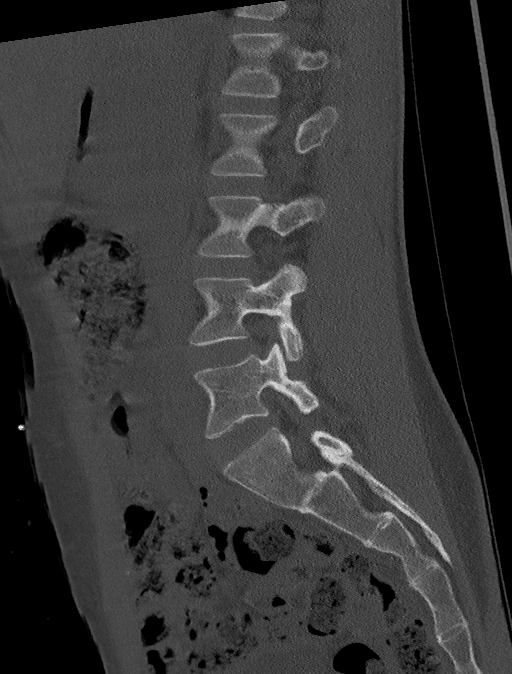
[im 31/73  bone]
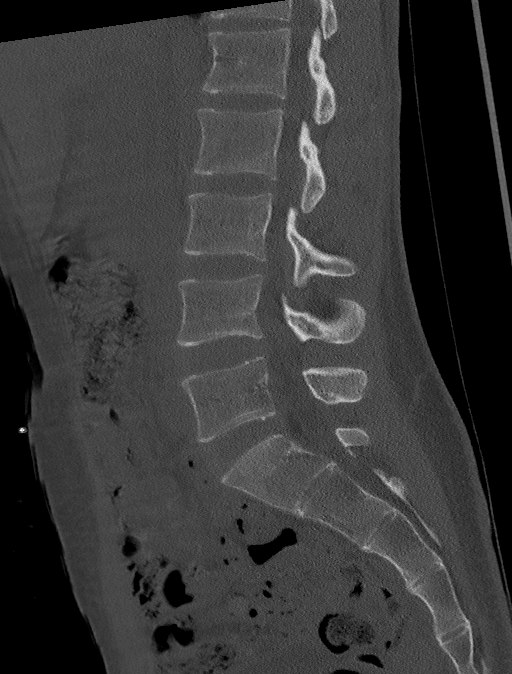
[im 37/73  soft-tissue]
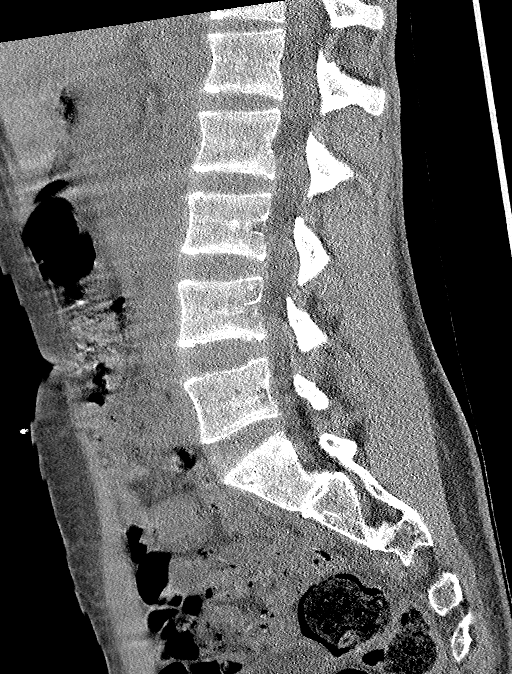
[im 37/73  bone]
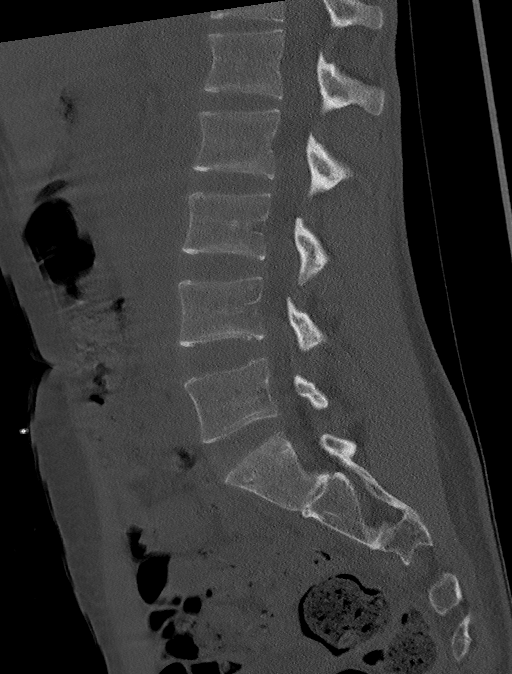
[im 43/73  bone]
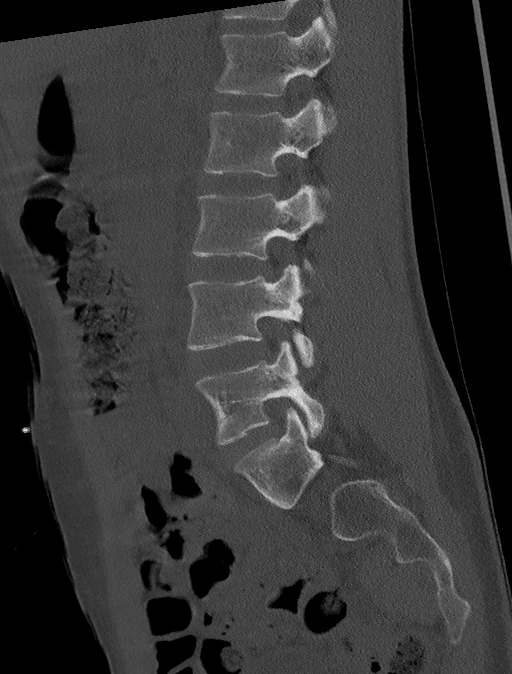
[im 49/73  bone]
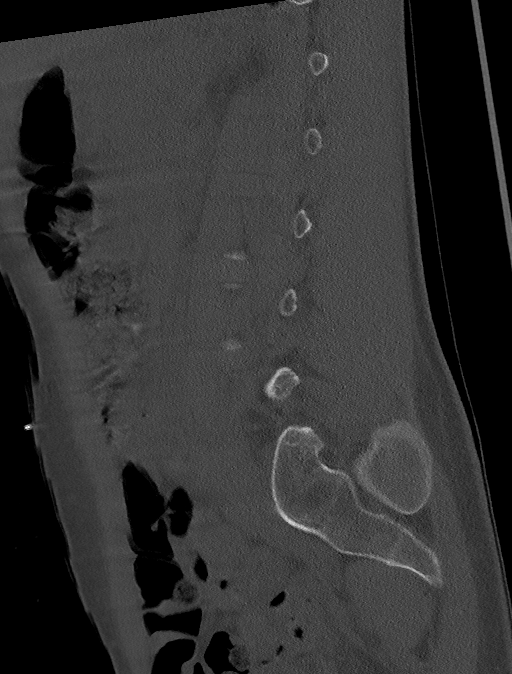

[13 of 33 positions shown; findings below may reference images not displayed]

FINDINGS: Segmentation: Normal, concordant with the thoracic spine numbering
today.

Alignment: Mild straightening of lumbar lordosis.

Vertebrae: Lumbar vertebrae appear intact. Congenital unfused
ossification center of the right L1 transverse process. Spina bifida
occulta at S1 (normal variant). Intact visible sacrum and pelvis.
Visible lower ribs appear intact.

Paraspinal and other soft tissues: Lumbar paraspinal soft tissues
are within normal limits. Negative visible noncontrast abdominal and
pelvic viscera.

Disc levels: No degenerative changes or CT evidence of lumbar spinal
stenosis.
IMPRESSION: No acute traumatic injury identified in the lumbar spine.

## 2020-04-29 IMAGING — MR MR LUMBAR SPINE W/O CM
4 of 5 series · 29 of 48 positions shown · non-contrast
Comparison: None.

CLINICAL DATA: Fall.  Loss of sensation to the left foot.

EXAM:
MRI THORACIC AND LUMBAR SPINE WITHOUT CONTRAST
TECHNIQUE: Multiplanar and multiecho pulse sequences of the thoracic and lumbar
spine were obtained without intravenous contrast.

[Series 3: T1 · sagittal · 4.0mm · 0.88mm/px · 7 of 16 slices shown (1 of 2)]
[im 1/16]
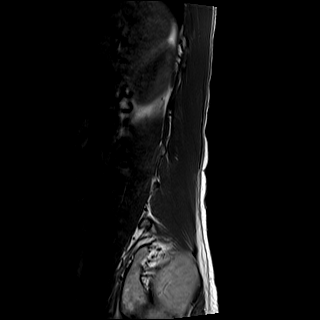
[im 3/16]
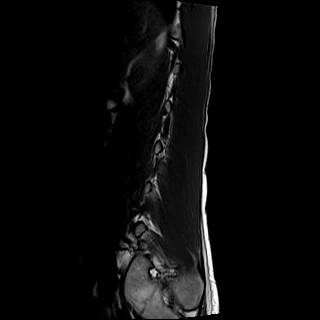
[im 6/16]
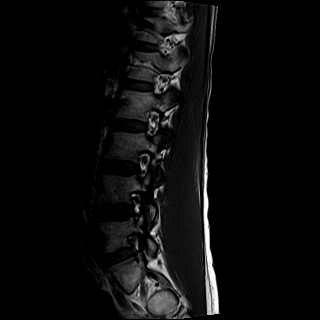
[im 8/16]
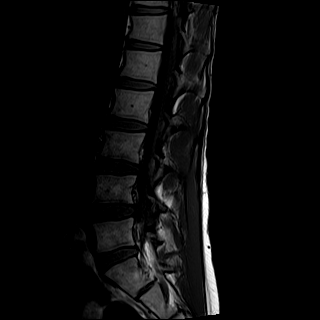
[im 11/16]
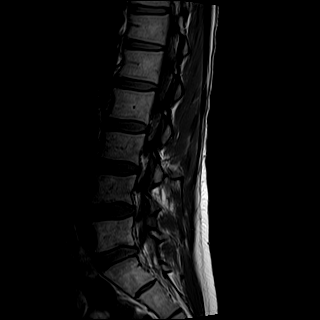
[im 13/16]
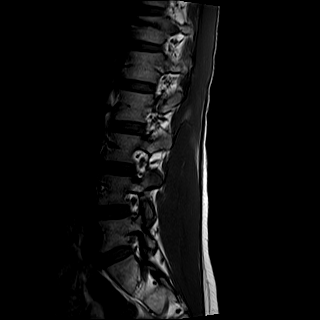
[im 16/16]
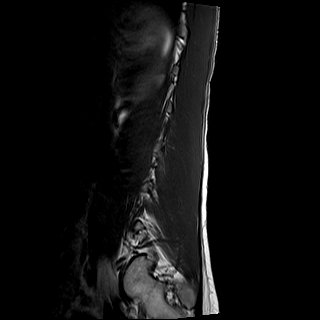

[Series 4: T2 · sagittal · 4.0mm · 0.73mm/px · 7 of 16 slices shown (1 of 2)]
[im 1/16]
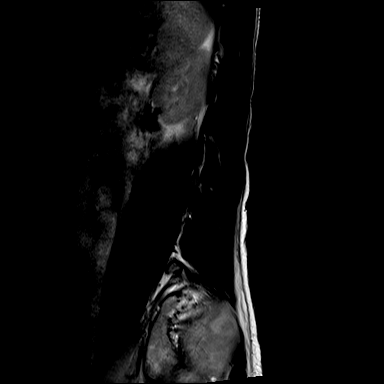
[im 3/16]
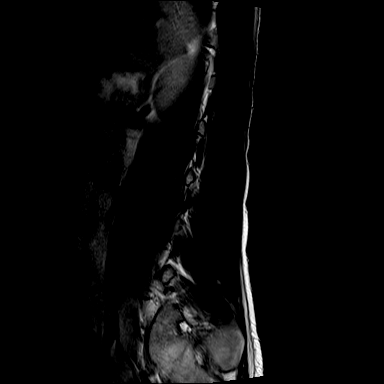
[im 6/16]
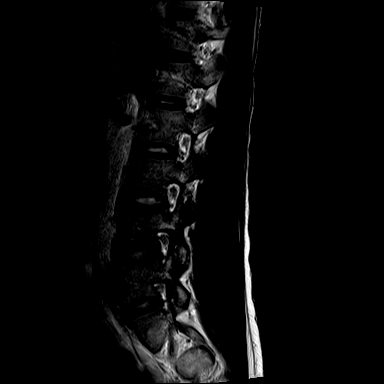
[im 8/16]
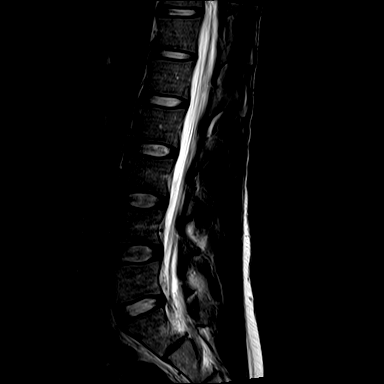
[im 11/16]
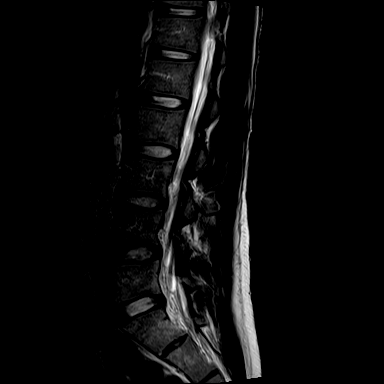
[im 13/16]
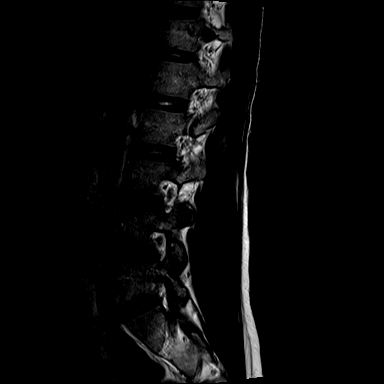
[im 16/16]
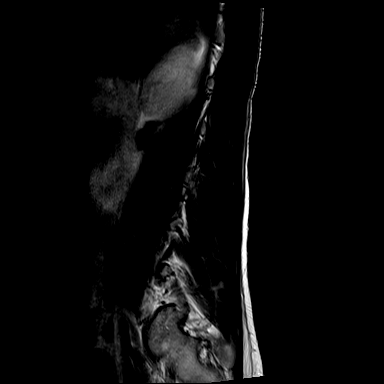

[Series 6: T1 · axial · 5.0mm · 0.34mm/px · z∈[-359,-167]mm · 6 of 30 slices shown (2 of 2)]
[im 1/30]
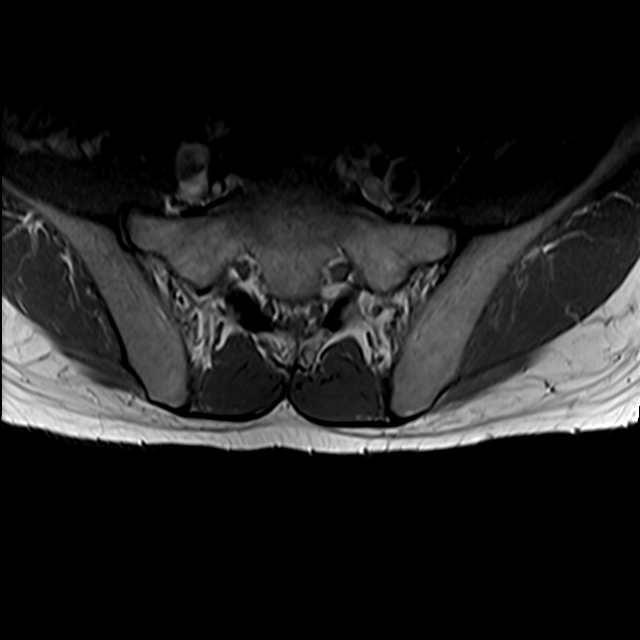
[im 5/30]
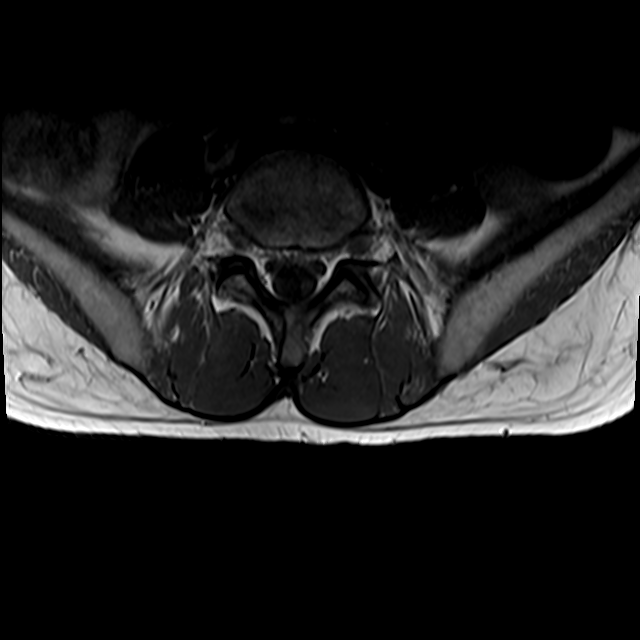
[im 10/30]
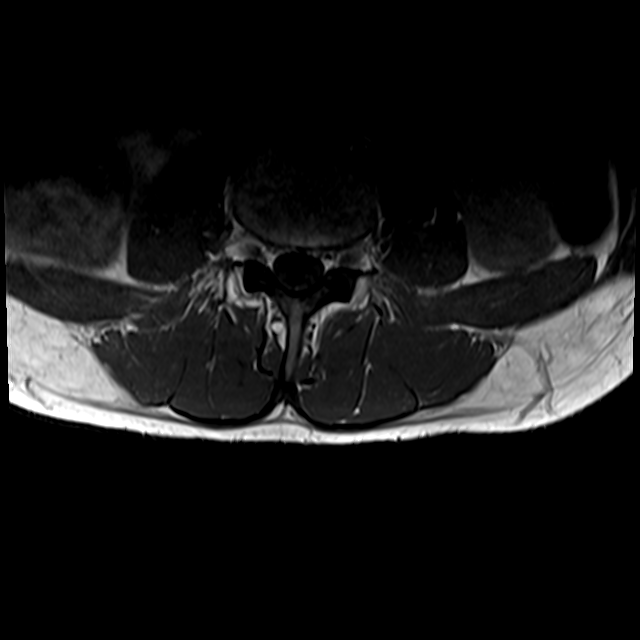
[im 13/30]
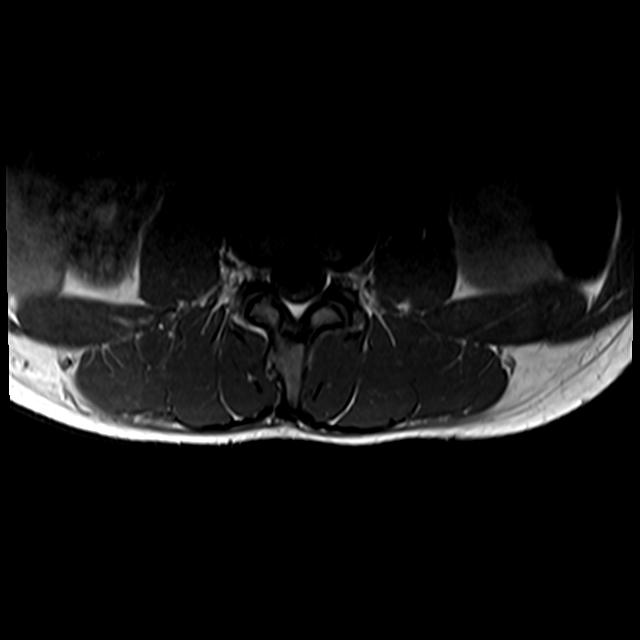
[im 15/30]
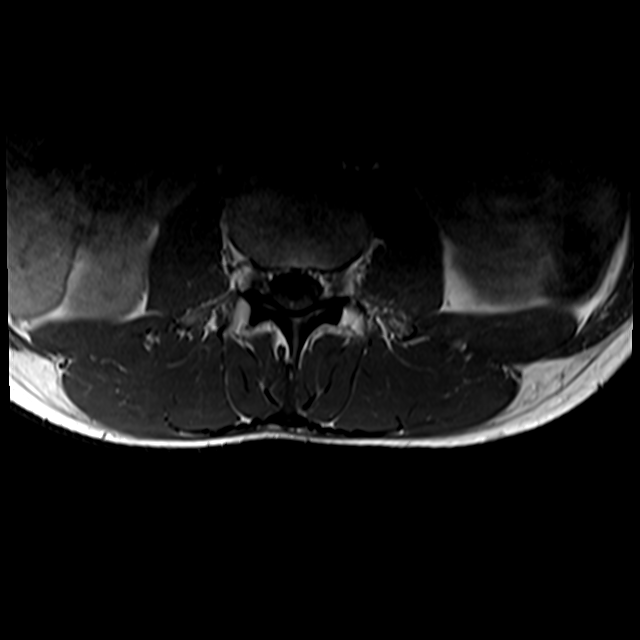
[im 25/30]
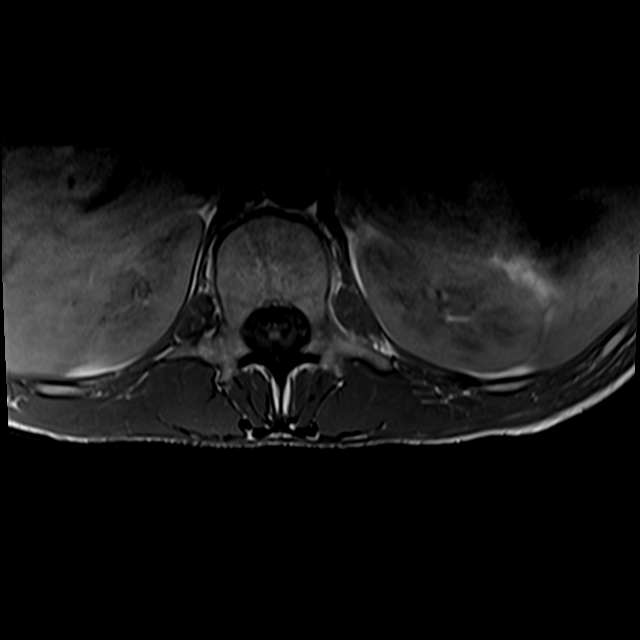

[Series 8: T2 · axial · 5.0mm · 0.57mm/px · z∈[-357,-132]mm · 9 of 30 slices shown (2 of 2)]
[im 1/30]
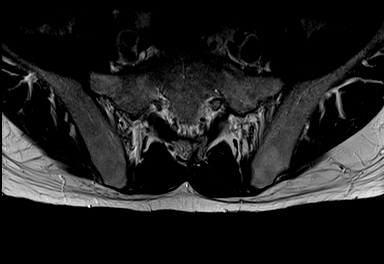
[im 5/30]
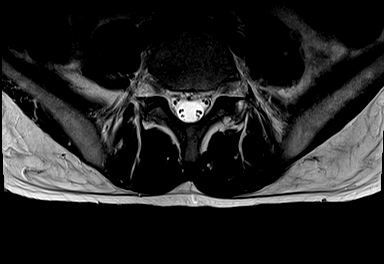
[im 10/30]
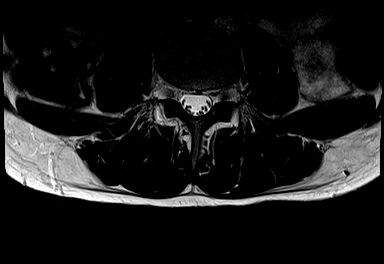
[im 13/30]
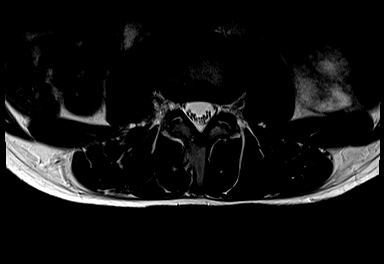
[im 15/30]
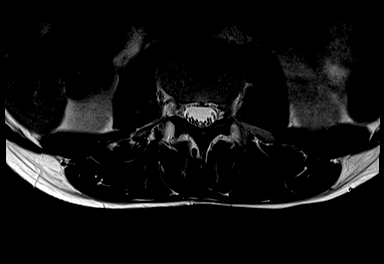
[im 17/30]
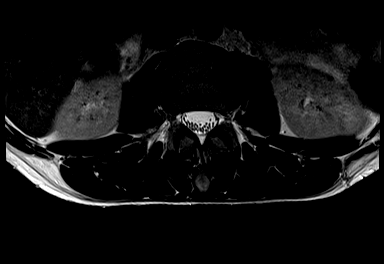
[im 20/30]
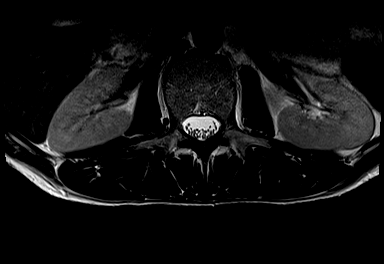
[im 25/30]
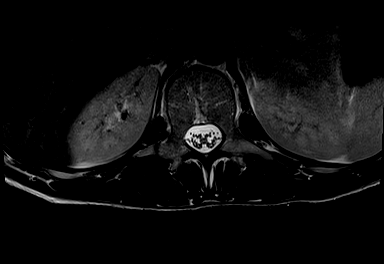
[im 30/30]
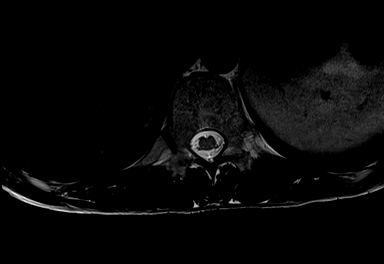

[29 of 48 positions shown; findings below may reference images not displayed]

FINDINGS: MRI THORACIC SPINE FINDINGS

Alignment:  Physiologic.

Vertebrae: There is trace edema beneath the anterior superior corner
of T2. Mild height loss at T3 and T4 without marrow edema.

Cord:  Normal signal and morphology.

Paraspinal and other soft tissues: Negative.

Disc levels:

No spinal canal or neural foraminal stenosis.

MRI LUMBAR SPINE FINDINGS

Segmentation:  Standard.

Alignment:  Physiologic.

Vertebrae:  No fracture, evidence of discitis, or bone lesion.

Conus medullaris and cauda equina: Conus extends to the L1 level.
Conus and cauda equina appear normal.

Paraspinal and other soft tissues: Negative.

Disc levels:

No spinal canal or neural foraminal stenosis.
IMPRESSION: 1. No acute abnormality of the thoracic or lumbar spine.
2. Trace edema beneath the anterior superior corner of T2. This may
be an acute fracture, but there is no canal compromise or foraminal
stenosis.
3. Mild height loss of T3 and T4 without marrow edema.
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
## 2020-04-29 IMAGING — CT CT CERVICAL SPINE W/O CM
3 of 4 series · 13 of 33 positions shown, 16 images · non-contrast
Comparison: Head CT today reported separately.

CLINICAL DATA: 24-year-old male status post fall with pain.

EXAM:
CT CERVICAL SPINE WITHOUT CONTRAST
TECHNIQUE: Multidetector CT imaging of the cervical spine was performed without
intravenous contrast. Multiplanar CT image reconstructions were also
generated.

[Series 4: c_spine 2.0 st · axial · 0.30mm/px · z∈[-266,-128]mm · 5 of 97 slices shown, 7 images]
[im 14/97  soft-tissue]
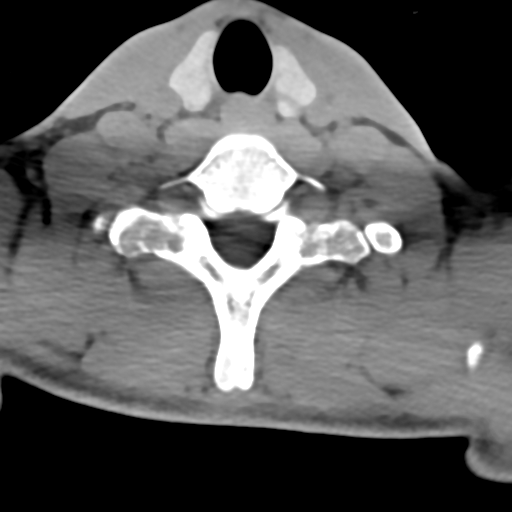
[im 14/97  bone]
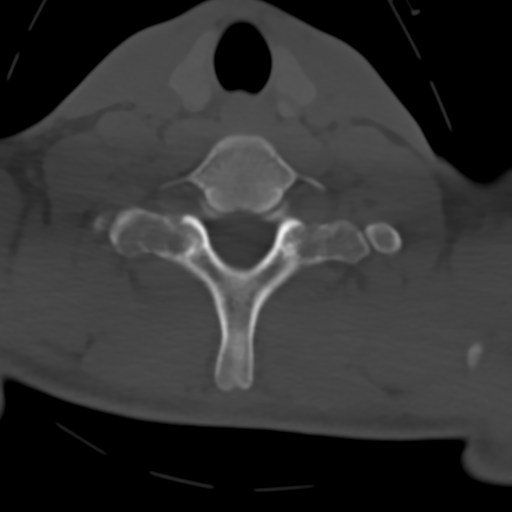
[im 28/97  bone]
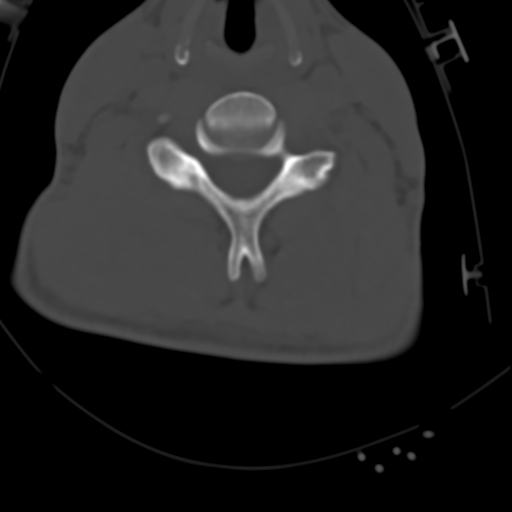
[im 55/97  bone]
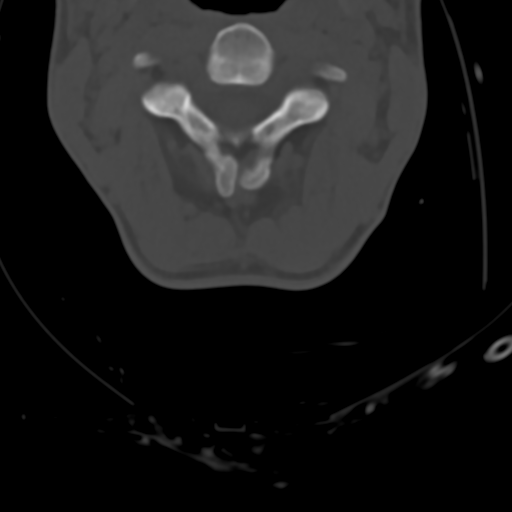
[im 69/97  bone]
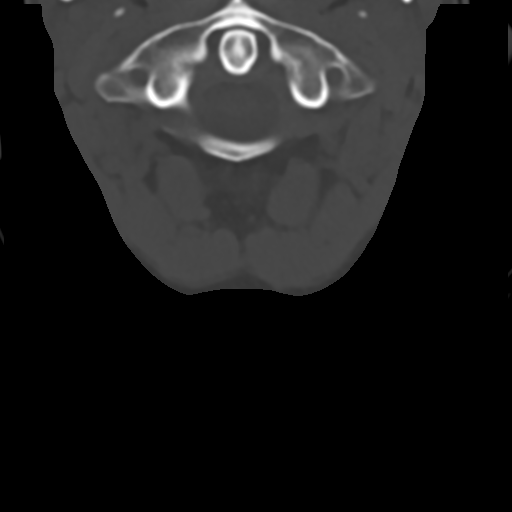
[im 83/97  soft-tissue]
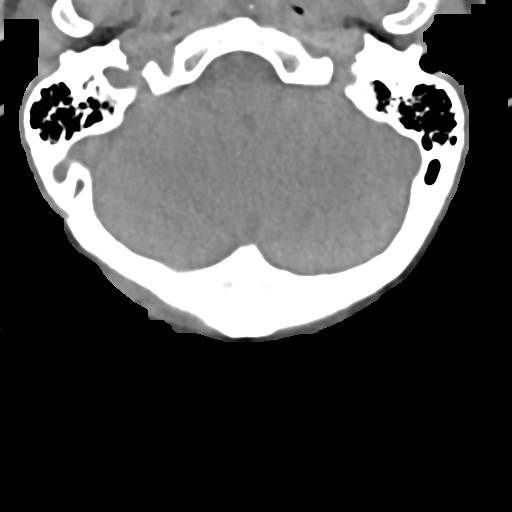
[im 83/97  bone]
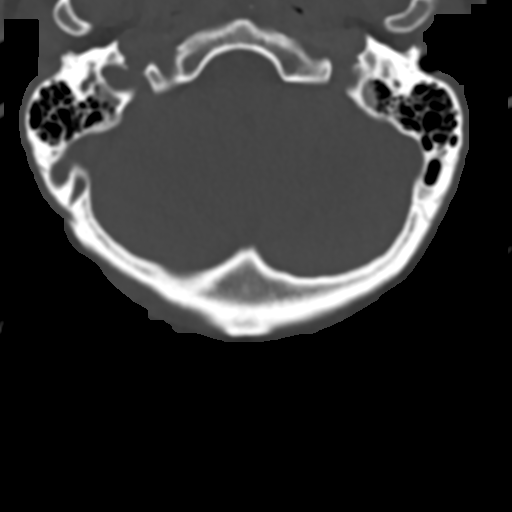

[Series 6: c_spine 2.0 sag bone · sagittal · 0.28mm/px · 5 of 65 slices shown, 6 images]
[im 22/65  bone]
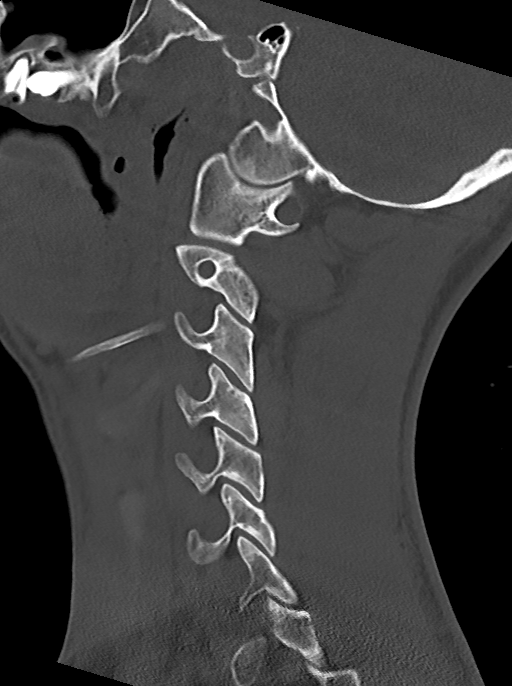
[im 27/65  bone]
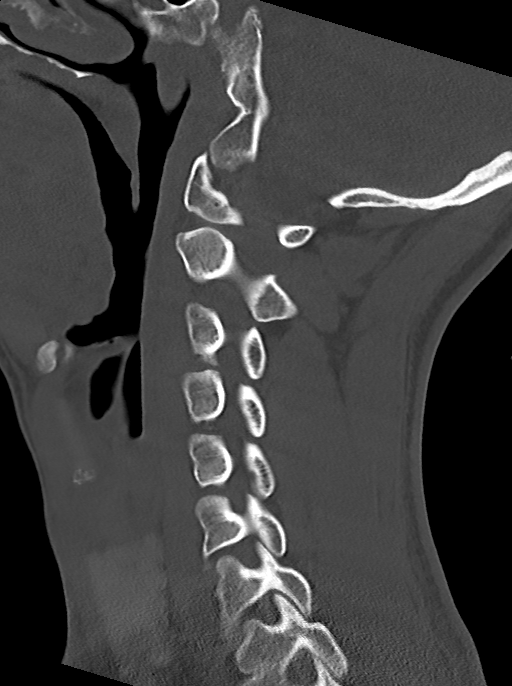
[im 33/65  soft-tissue]
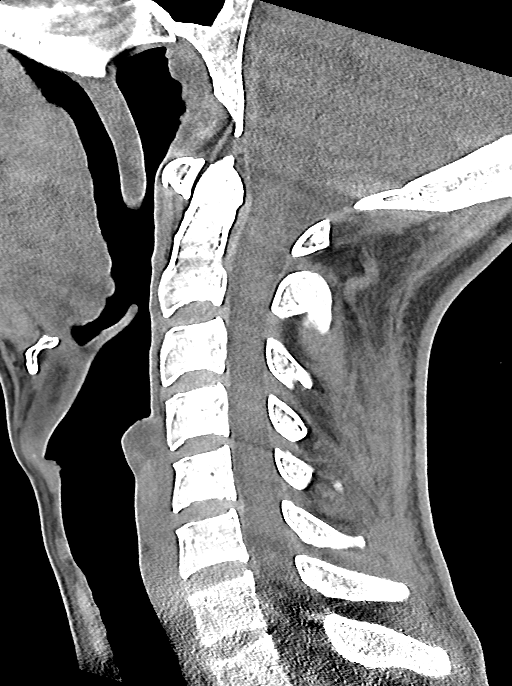
[im 33/65  bone]
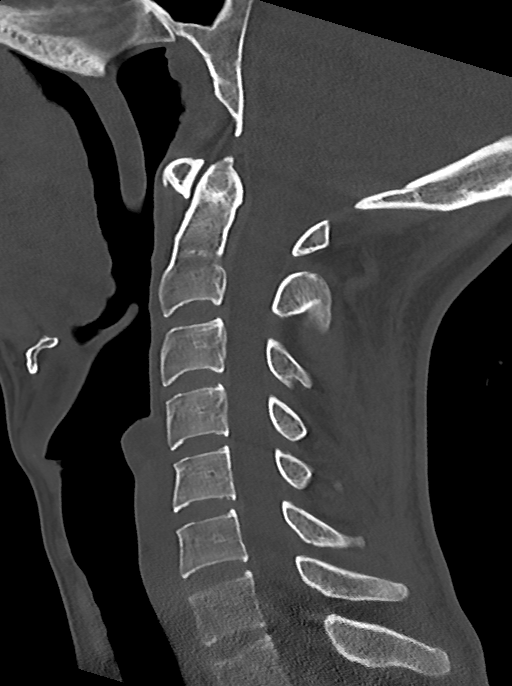
[im 38/65  bone]
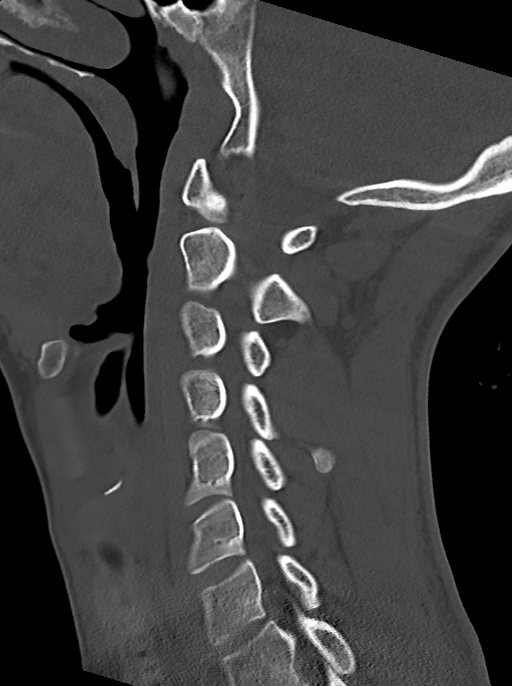
[im 43/65  bone]
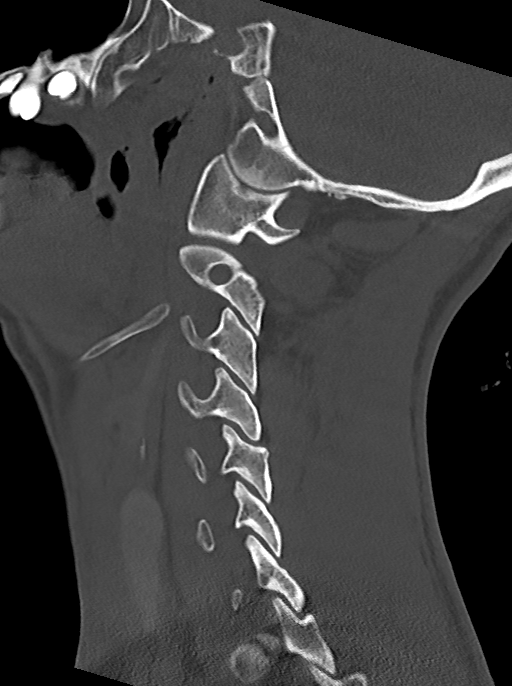

[Series 7: c_spine 2.0 cor bone · coronal · 0.28mm/px · 3 of 66 slices shown]
[im 14/66  bone]
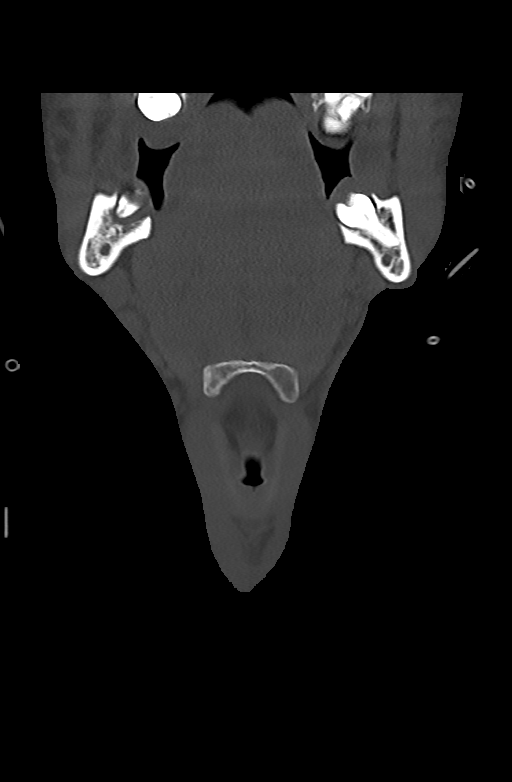
[im 27/66  bone]
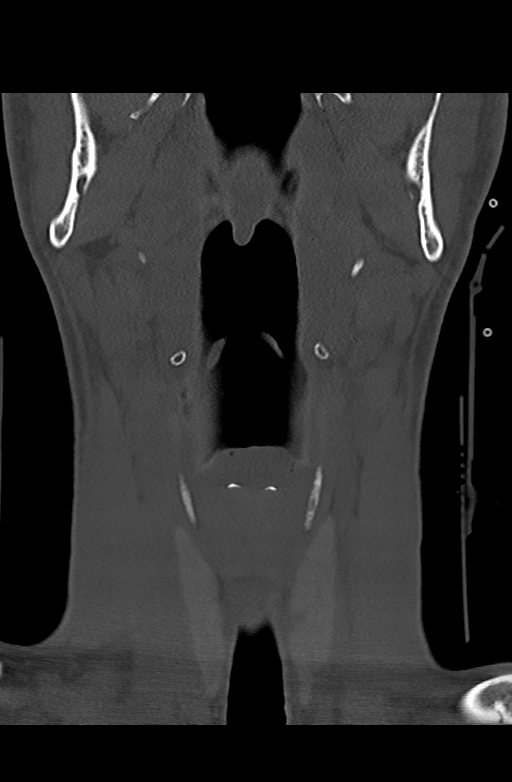
[im 40/66  bone]
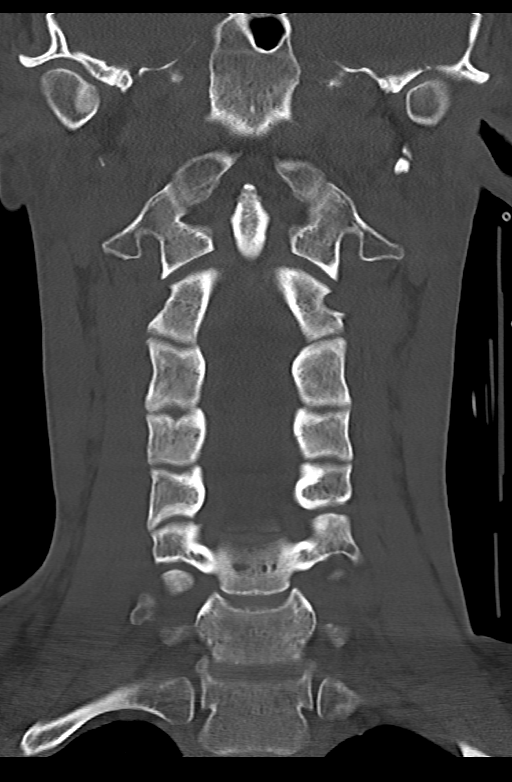

[13 of 33 positions shown; findings below may reference images not displayed]

FINDINGS: Alignment: Mild straightening of cervical lordosis. Cervicothoracic
junction alignment is within normal limits. Bilateral posterior
element alignment is within normal limits.

Skull base and vertebrae: Visualized skull base is intact. No
atlanto-occipital dissociation. Cervical vertebrae appear intact and
normal.

Soft tissues and spinal canal: No prevertebral fluid or swelling. No
visible canal hematoma. Negative noncontrast visible neck soft
tissues.

Disc levels:  No degenerative changes.

Upper chest: Thoracic spine today reported separately. Negative
visible lung apices.

Other: Poor posterior dentition, with pronounced erosion of the
posterior body of the right mandible around a small fragment of the
residual right posterior mandible molar (sagittal image 17).
IMPRESSION: 1. No acute traumatic injury identified in the cervical spine.
2. CT Thoracic Spine reported separately.
3. Carious posterior dentition with pronounced erosion of the right
posterior body of the mandible around a fragment of the posterior
molar.

## 2020-04-29 IMAGING — DX DG PORTABLE PELVIS
1 series · 1 of 1 positions shown · non-contrast
Comparison: [DATE]

CLINICAL DATA: Fall in shower

EXAM:
PORTABLE PELVIS 1-2 VIEWS

[pelvis]
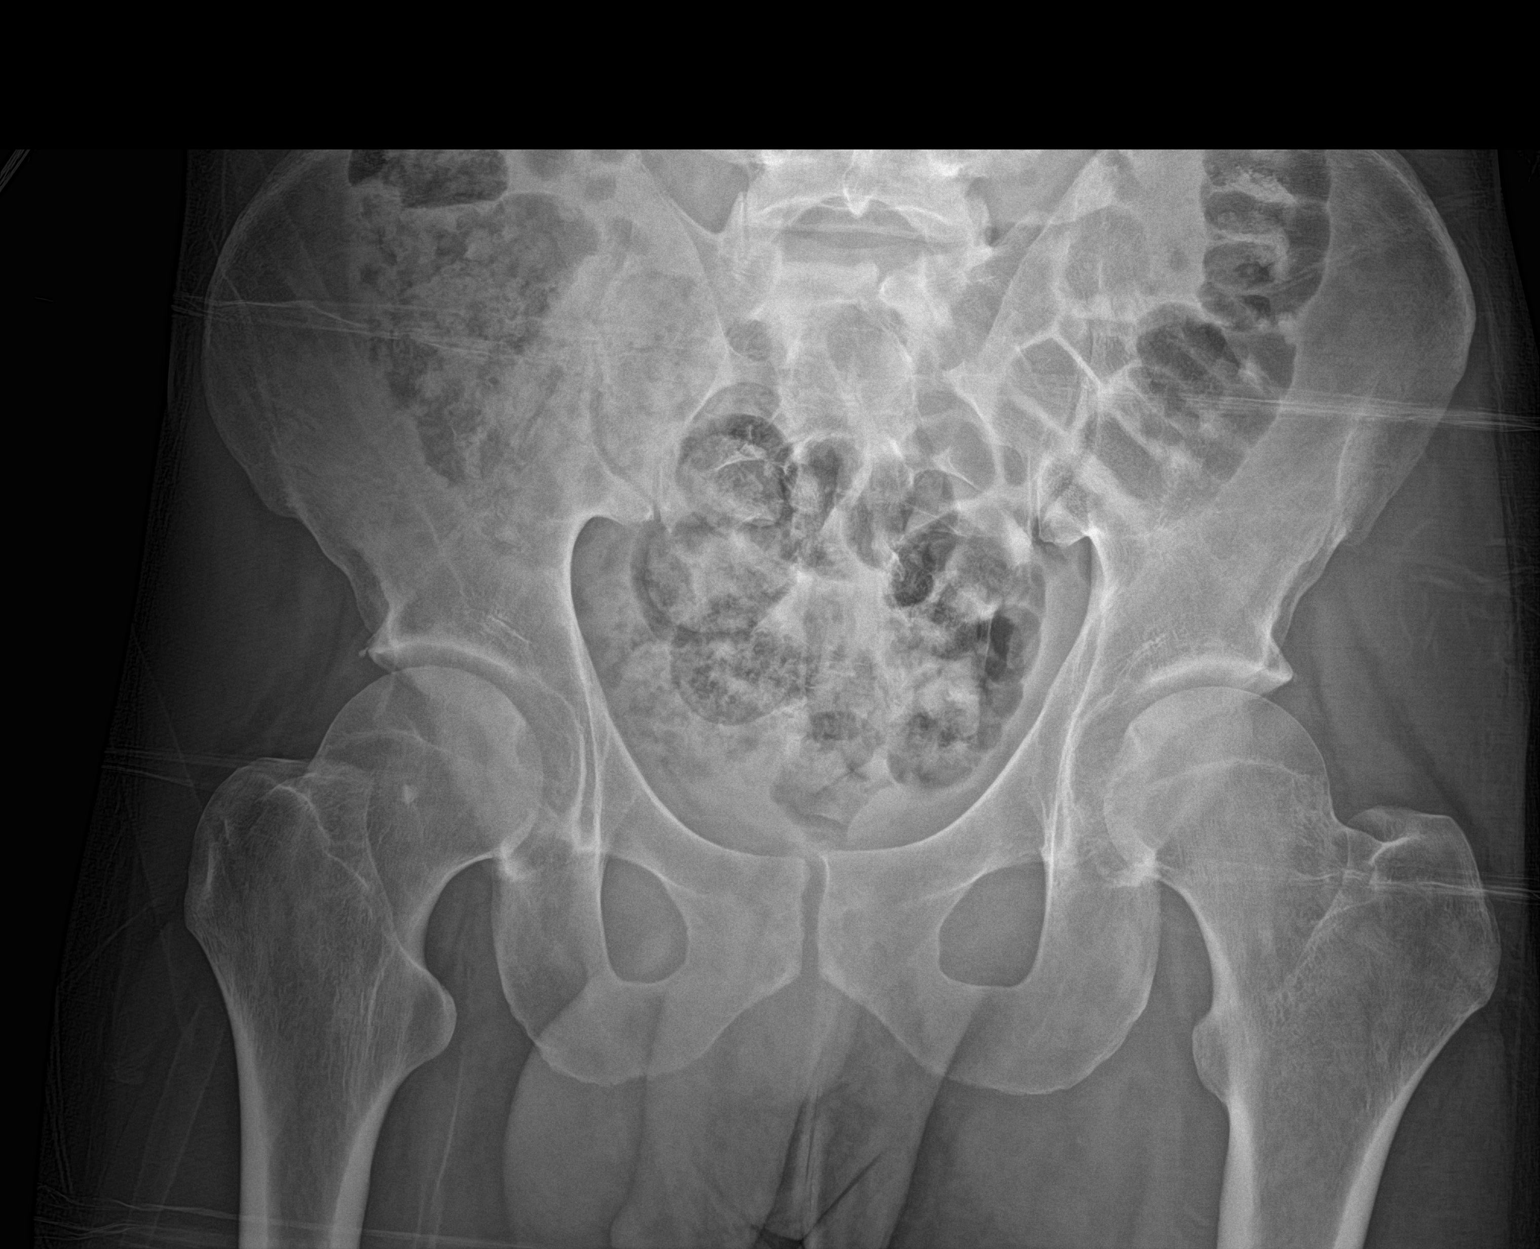

[1 of 1 positions shown; findings below may reference images not displayed]

FINDINGS: There is no evidence of pelvic fracture or diastasis. No pelvic bone
lesions are seen.
IMPRESSION: Negative.

## 2020-04-29 IMAGING — MR MR THORACIC SPINE W/O CM
4 of 8 series · 23 of 48 positions shown · non-contrast
Comparison: None.

CLINICAL DATA: Fall.  Loss of sensation to the left foot.

EXAM:
MRI THORACIC AND LUMBAR SPINE WITHOUT CONTRAST
TECHNIQUE: Multiplanar and multiecho pulse sequences of the thoracic and lumbar
spine were obtained without intravenous contrast.

[Series 16: T1 · sagittal · 3.0mm · 0.62mm/px · 3 of 13 slices shown (1 of 2)]
[im 1/13]
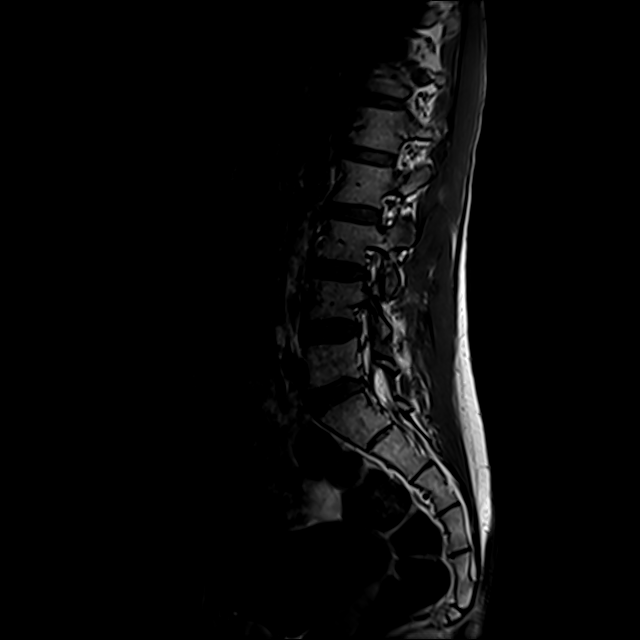
[im 7/13]
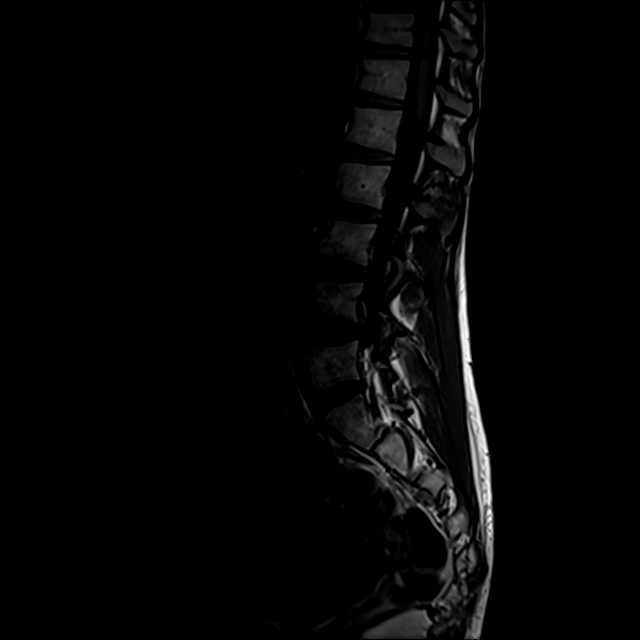
[im 13/13]
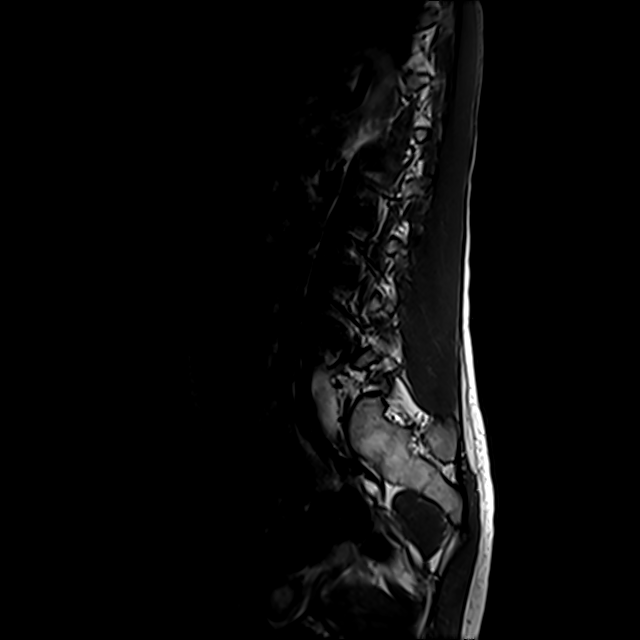

[Series 17: T1 · sagittal · 3.0mm · 0.62mm/px · 4 of 13 slices shown (2 of 2)]
[im 1/13]
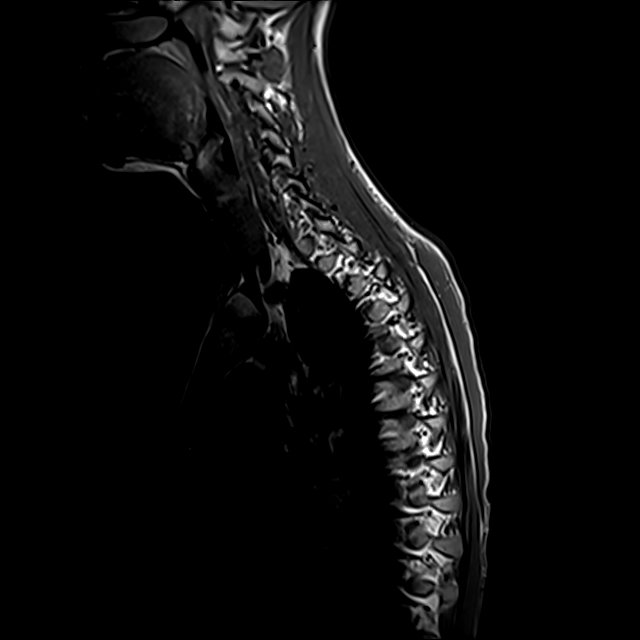
[im 5/13]
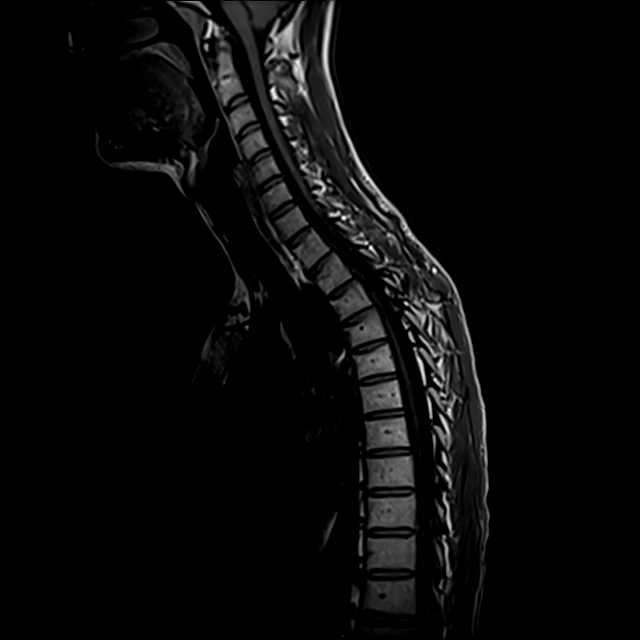
[im 9/13]
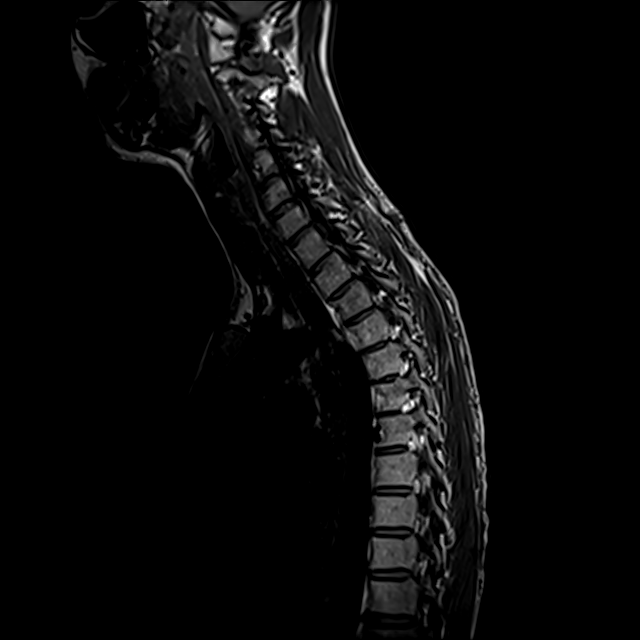
[im 13/13]
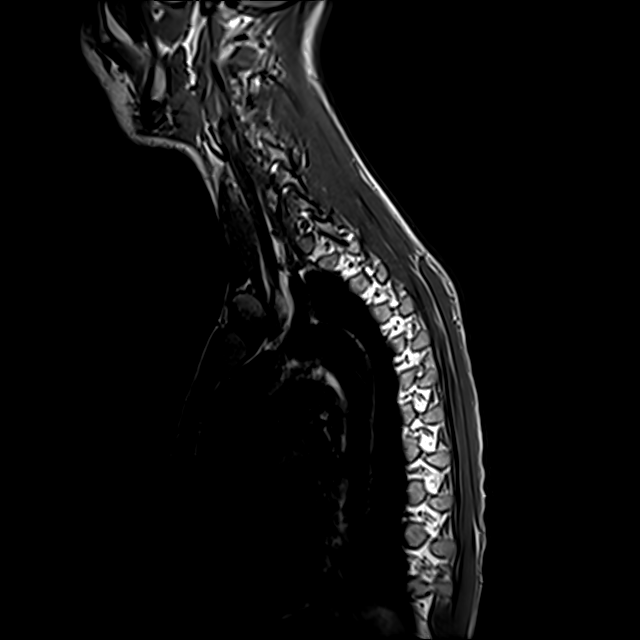

[Series 20: T2 · sagittal · 3.0mm · 0.71mm/px · 5 of 17 slices shown (1 of 2)]
[im 1/17]
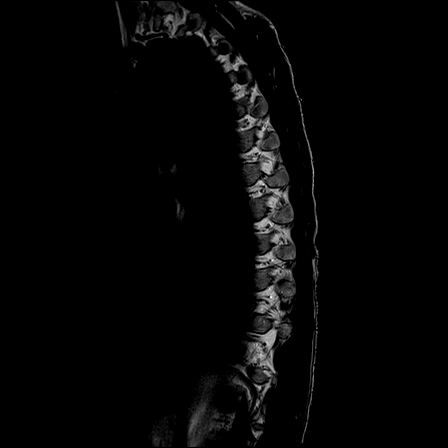
[im 5/17]
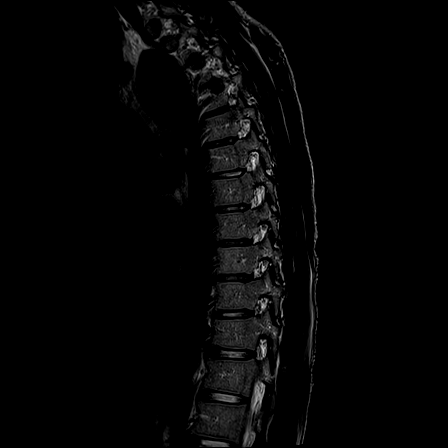
[im 9/17]
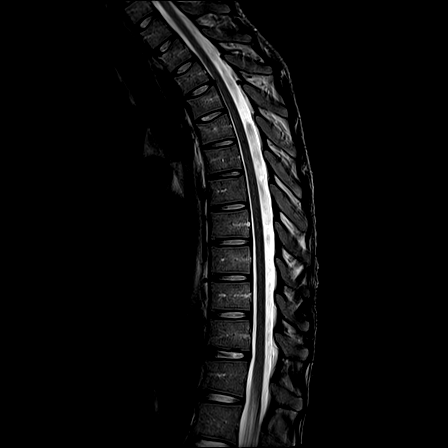
[im 13/17]
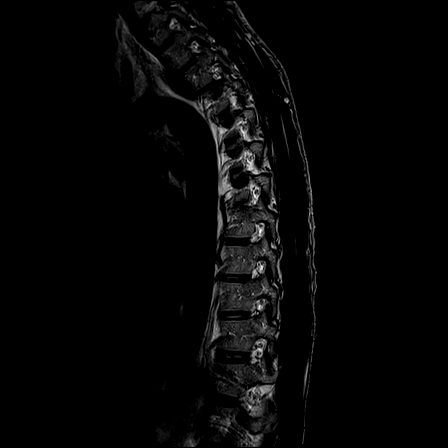
[im 17/17]
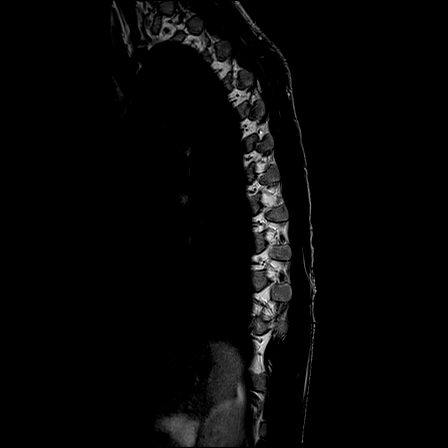

[Series 23: T2 · axial · 5.0mm · 0.59mm/px · z∈[-151,+99]mm · 11 of 39 slices shown (2 of 2)]
[im 1/39]
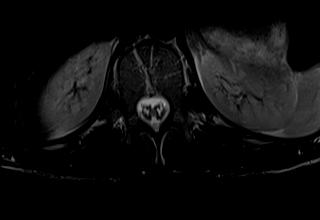
[im 4/39]
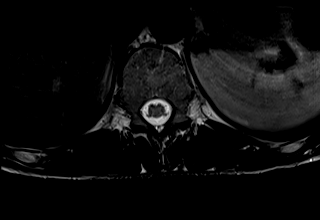
[im 8/39]
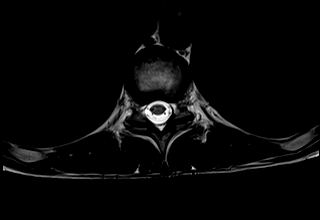
[im 12/39]
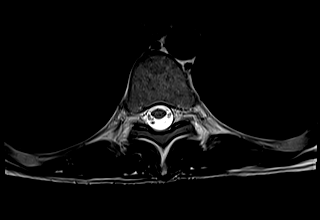
[im 16/39]
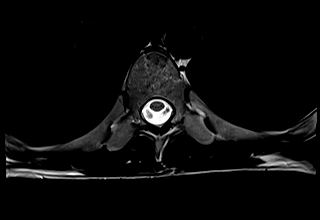
[im 20/39]
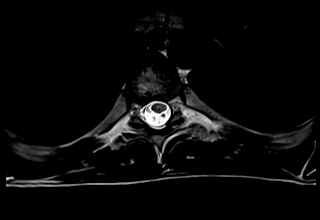
[im 23/39]
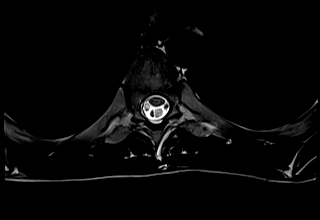
[im 27/39]
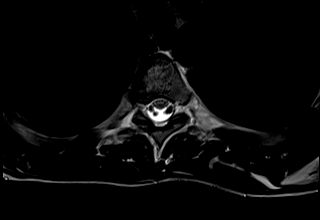
[im 31/39]
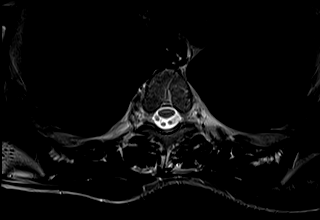
[im 35/39]
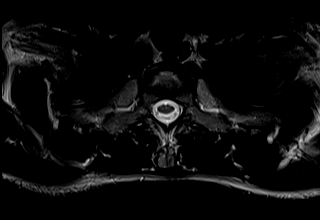
[im 39/39]
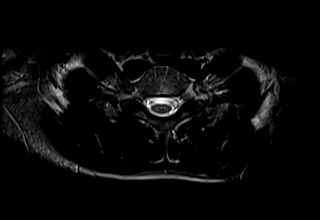

[23 of 48 positions shown; findings below may reference images not displayed]

FINDINGS: MRI THORACIC SPINE FINDINGS

Alignment:  Physiologic.

Vertebrae: There is trace edema beneath the anterior superior corner
of T2. Mild height loss at T3 and T4 without marrow edema.

Cord:  Normal signal and morphology.

Paraspinal and other soft tissues: Negative.

Disc levels:

No spinal canal or neural foraminal stenosis.

MRI LUMBAR SPINE FINDINGS

Segmentation:  Standard.

Alignment:  Physiologic.

Vertebrae:  No fracture, evidence of discitis, or bone lesion.

Conus medullaris and cauda equina: Conus extends to the L1 level.
Conus and cauda equina appear normal.

Paraspinal and other soft tissues: Negative.

Disc levels:

No spinal canal or neural foraminal stenosis.
IMPRESSION: 1. No acute abnormality of the thoracic or lumbar spine.
2. Trace edema beneath the anterior superior corner of T2. This may
be an acute fracture, but there is no canal compromise or foraminal
stenosis.
3. Mild height loss of T3 and T4 without marrow edema.

## 2020-04-29 IMAGING — CT CT HEAD W/O CM
4 series · 17 of 47 positions shown, 19 images · non-contrast
Comparison: Report of face CT [DATE] (no images available).

CLINICAL DATA: 24-year-old male status post fall with pain.

EXAM:
CT HEAD WITHOUT CONTRAST
TECHNIQUE: Contiguous axial images were obtained from the base of the skull
through the vertex without intravenous contrast.

[Series 3: head without · axial · non-contrast · 0.46mm/px · z∈[-123,+12]mm · 7 of 37 slices shown, 9 images]
[im 5/37  brain]
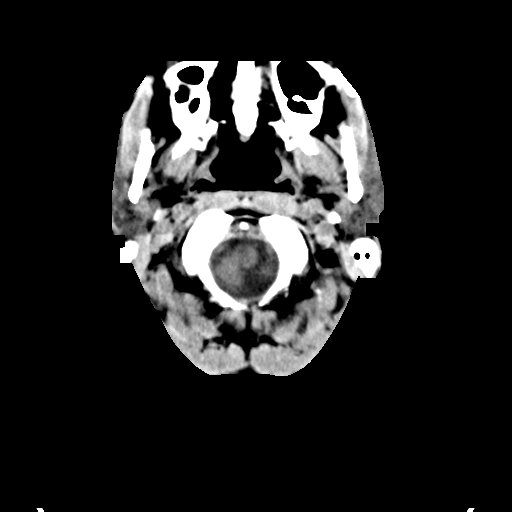
[im 5/37  bone]
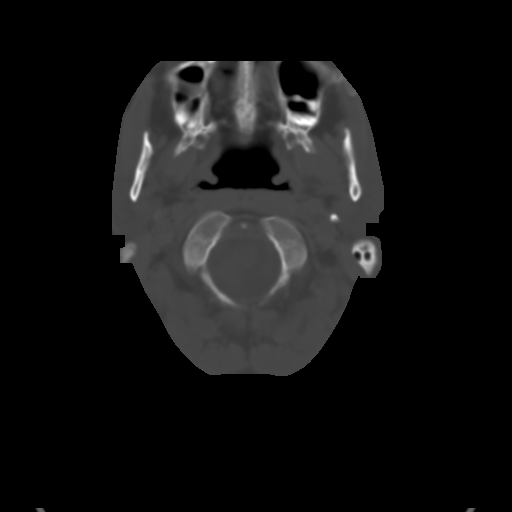
[im 10/37  brain]
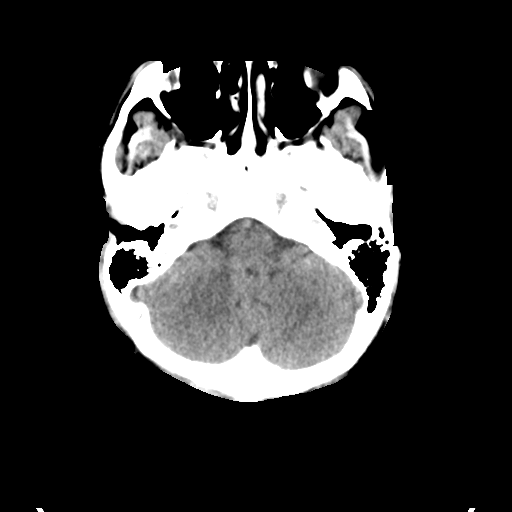
[im 14/37  brain]
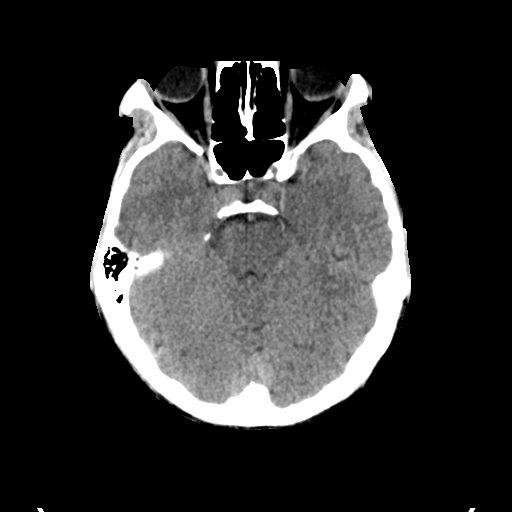
[im 19/37  brain]
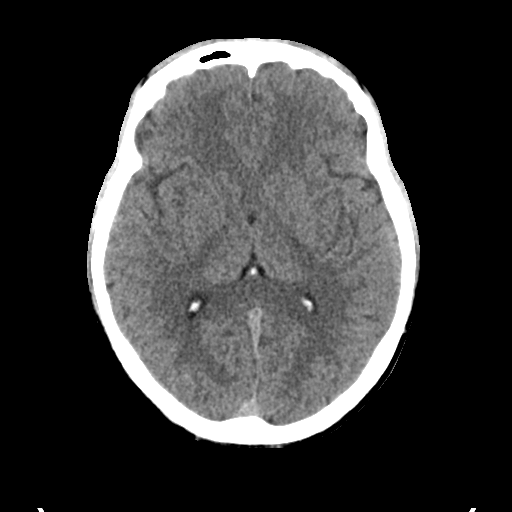
[im 23/37  brain]
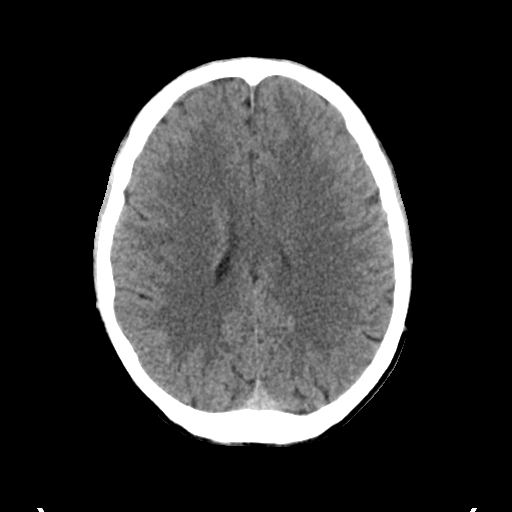
[im 23/37  bone]
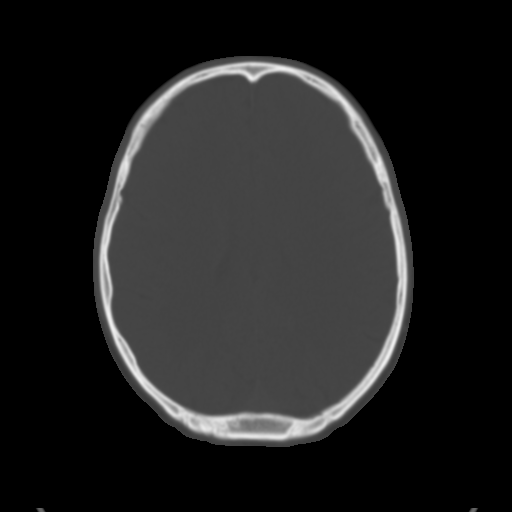
[im 28/37  brain]
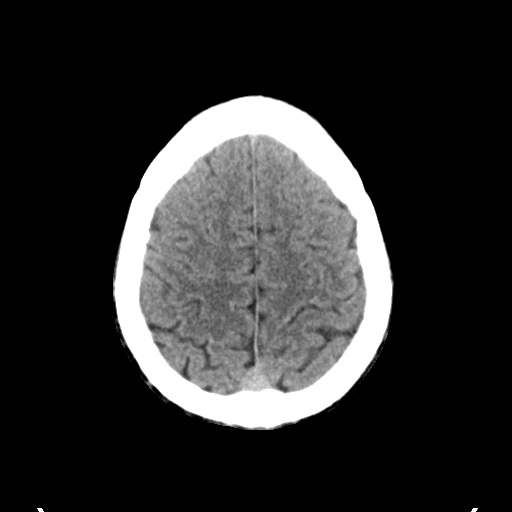
[im 32/37  brain]
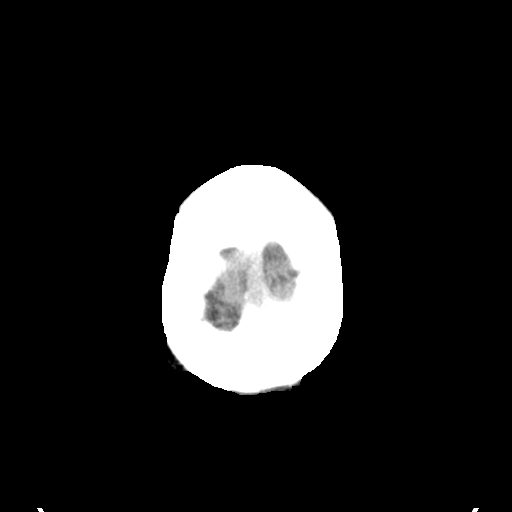

[Series 4: head bone · axial · 0.46mm/px · z∈[-125,-63]mm · 4 of 92 slices shown]
[im 10/92  bone]
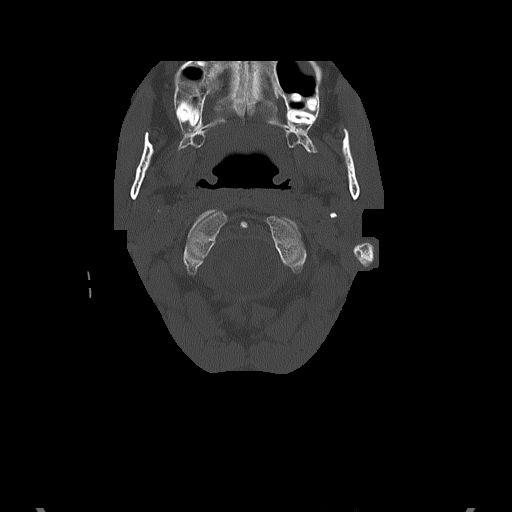
[im 19/92  bone]
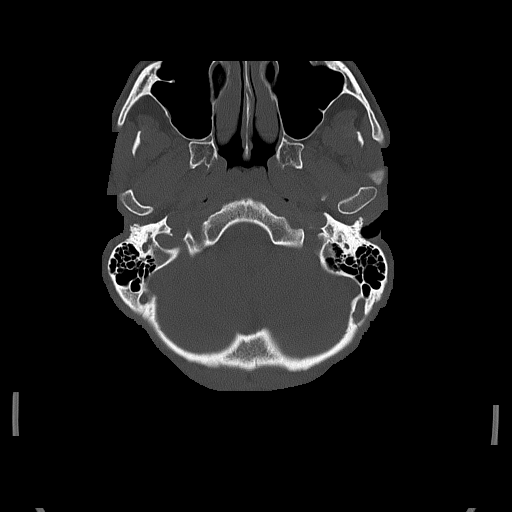
[im 28/92  bone]
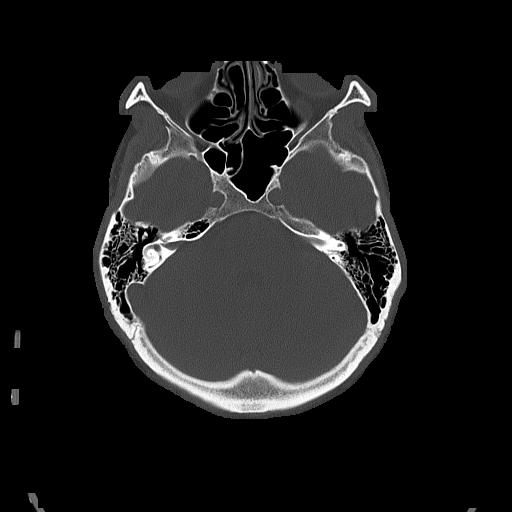
[im 41/92  bone]
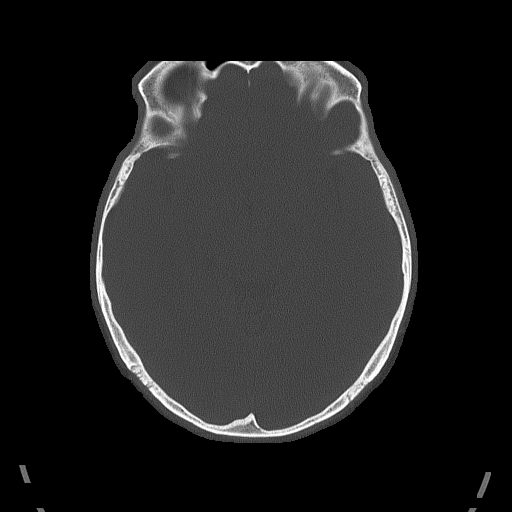

[Series 5: head without cor · coronal · non-contrast · 0.35mm/px · 3 of 71 slices shown]
[im 24/71  brain]
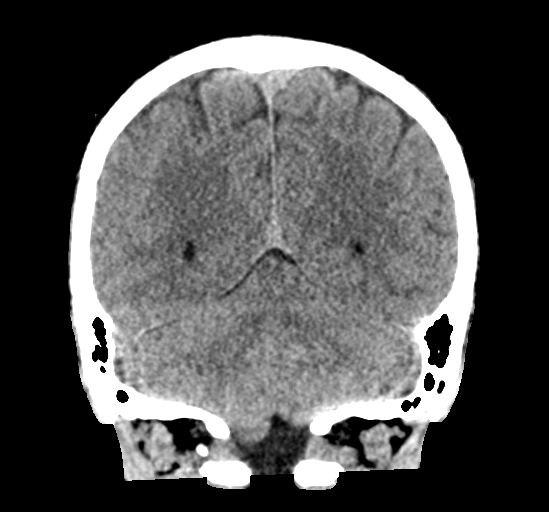
[im 32/71  brain]
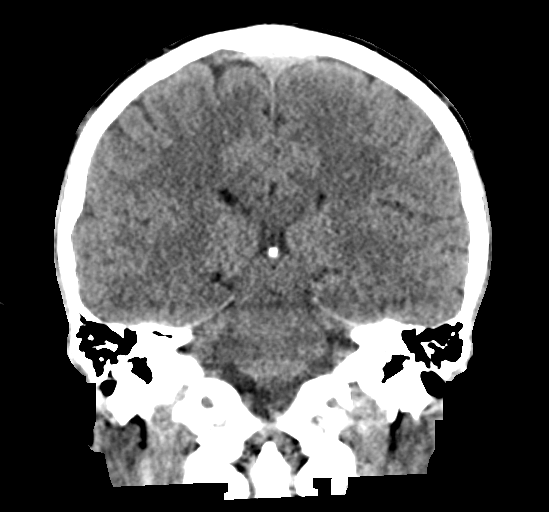
[im 39/71  brain]
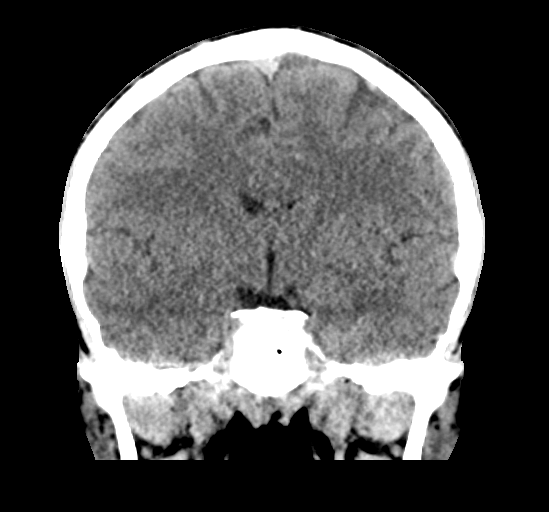

[Series 6: head without sag · sagittal · non-contrast · 0.35mm/px · 3 of 57 slices shown]
[im 19/57  brain]
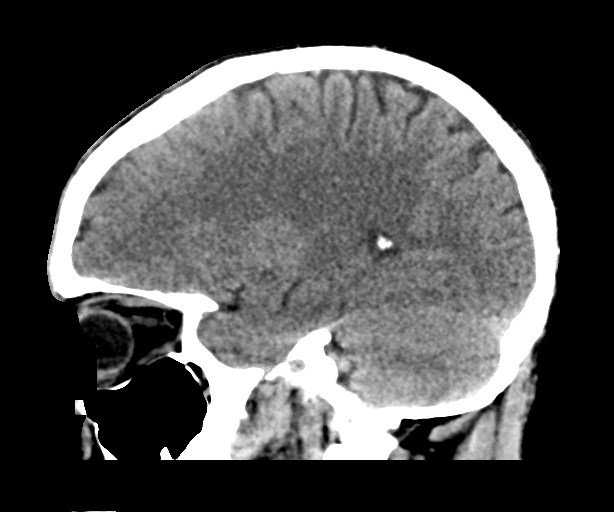
[im 29/57  brain]
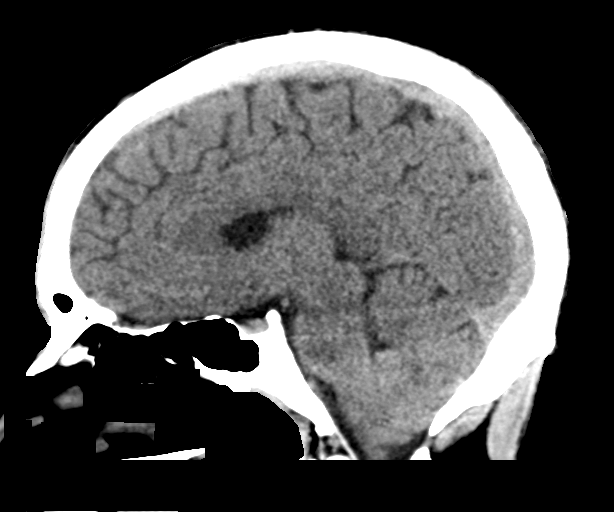
[im 38/57  brain]
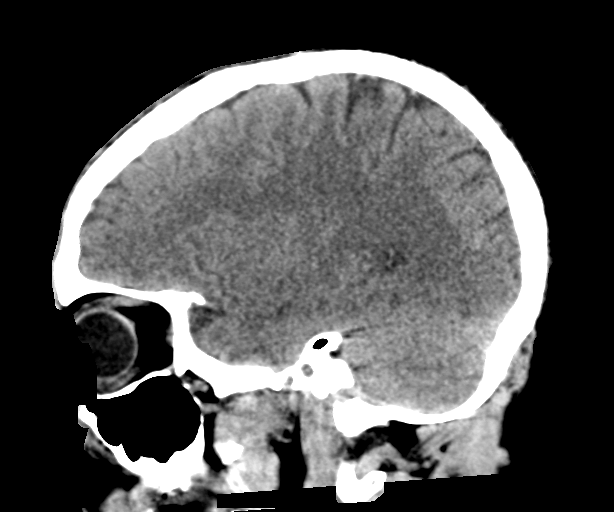

[17 of 47 positions shown; findings below may reference images not displayed]

FINDINGS: Brain: Normal cerebral volume. No midline shift, ventriculomegaly,
mass effect, evidence of mass lesion, intracranial hemorrhage or
evidence of cortically based acute infarction. Gray-white matter
differentiation is within normal limits throughout the brain.

Vascular: No suspicious intracranial vascular hyperdensity.

Skull: Extensive inflammatory periapical lucency about the left
anterior maxillary dentition. Unerupted possible supernumerary right
maxillary tooth.

No skull fracture identified.

Sinuses/Orbits: Mild maxillary alveolar recess mucosal thickening.
Other paranasal sinuses, tympanic cavities and mastoids are clear.

Other: No acute orbit or scalp soft tissue injury identified.
IMPRESSION: 1. Normal noncontrast CT appearance of the brain. No acute traumatic
injury identified.
2. Poor left maxillary dentition.

## 2020-04-29 MED ORDER — FENTANYL CITRATE (PF) 100 MCG/2ML IJ SOLN
50.0000 ug | Freq: Once | INTRAMUSCULAR | Status: AC
  Administered 2020-04-29: 50 ug via INTRAVENOUS
  Filled 2020-04-29: qty 2
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
## 2020-04-29 NOTE — Progress Notes (Signed)
   04/29/20 1958  Clinical Encounter Type  Visited With Other (Comment) (Chaplain responded to level 2 trauma call)  Visit Type ED  Referral From Nurse  Consult/Referral To Chaplain  Chaplain responded to level 2 trauma prior to arrival. When patient arrived no family present, medical staff treating patient. Advised staff to call if Chaplain services are needed. This note was prepared by Deneen Harts, M.Div..  For questions please contact by phone [phone].

## 2020-04-29 NOTE — ED Provider Notes (Signed)
[facility] EMERGENCY DEPARTMENT Provider Note   CSN: [id number] Arrival date & time: 04/29/20  2010     History Chief Complaint  Patient presents with  . Fall    Aaron Holder is a 24 y.o. male with no past medical history who presents as a level 2 trauma.  Patient presents with EMS from jail after having slipped and fallen on his back while getting out of the shower.  Per EMS, patient had decreased sensation in his left foot and could not move it.  On arrival, patient is not moving either of his lower extremities and says he cannot feel them.  Also says he hit his head, but did not lose consciousness.  Not on blood thinners.  He is also complaining of pain all along his back.  No emesis..   Fall This is a new problem. The problem occurs rarely. The problem has not changed since onset.Pertinent negatives include no chest pain, no abdominal pain, no headaches and no shortness of breath. Nothing aggravates the symptoms. He has tried nothing for the symptoms. The treatment provided no relief.       Past Medical History:  Diagnosis Date  . Seizures (HCC)     There are no problems to display for this patient.   History reviewed. No pertinent surgical history.     No family history on file.  Social History   Tobacco Use  . Smoking status: Never Smoker  . Smokeless tobacco: Never Used  Substance Use Topics  . Alcohol use: Yes  . Drug use: Not Currently    Home Medications Prior to Admission medications   Not on File    Allergies    Patient has no known allergies.  Review of Systems   Review of Systems  Constitutional: Negative for chills and fever.  HENT: Negative for ear pain and sore throat.   Eyes: Negative for pain and visual disturbance.  Respiratory: Negative for cough and shortness of breath.   Cardiovascular: Negative for chest pain and palpitations.  Gastrointestinal: Negative for abdominal pain and vomiting.  Genitourinary:  Negative for dysuria and hematuria.  Musculoskeletal: Positive for back pain and neck pain. Negative for arthralgias.  Skin: Negative for color change and rash.  Neurological: Positive for weakness and numbness. Negative for seizures, syncope and headaches.  All other systems reviewed and are negative.   Physical Exam Updated Vital Signs BP 118/83   Pulse 71   Temp 98 F (36.7 C) (Oral)   Resp 12   Ht 5\' 9"  (1.753 m)   Wt 68 kg   SpO2 98%   BMI 22.15 kg/m   Physical Exam Vitals and nursing note reviewed.  Constitutional:      Appearance: He is well-developed.  HENT:     Head: Normocephalic and atraumatic.  Eyes:     Conjunctiva/sclera: Conjunctivae normal.  Cardiovascular:     Rate and Rhythm: Normal rate and regular rhythm.     Heart sounds: No murmur heard.   Pulmonary:     Effort: Pulmonary effort is normal. No respiratory distress.     Breath sounds: Normal breath sounds.  Abdominal:     Palpations: Abdomen is soft.     Tenderness: There is no abdominal tenderness.  Musculoskeletal:     Cervical back: Neck supple.     Comments: Tenderness over C, T, L-spine, both hips.   Skin:    General: Skin is warm and dry.  Neurological:     Mental  Status: He is alert.     Comments: Good gluteal tone.  Endorses numbness of the lateral lower extremities.  Does not follow commands with lower extremities.     ED Results / Procedures / Treatments   Labs (all labs ordered are listed, but only abnormal results are displayed) Labs Reviewed  COMPREHENSIVE METABOLIC PANEL - Abnormal; Notable for the following components:      Result Value   Glucose, Bld 107 (*)    All other components within normal limits  I-STAT CHEM 8, ED - Abnormal; Notable for the following components:   Glucose, Bld 104 (*)    Calcium, Ion 1.05 (*)    Hemoglobin 12.9 (*)    HCT 38.0 (*)    All other components within normal limits  CBC  ETHANOL  LACTIC ACID, PLASMA  PROTIME-INR  URINALYSIS,  ROUTINE W REFLEX MICROSCOPIC  SAMPLE TO BLOOD BANK    EKG None  Radiology CT HEAD WO CONTRAST  Result Date: 04/29/2020 CLINICAL DATA:  24 year old male status post fall with pain. EXAM: CT HEAD WITHOUT CONTRAST TECHNIQUE: Contiguous axial images were obtained from the base of the skull through the vertex without intravenous contrast. COMPARISON:  Report of face CT 03/21/2017 (no images available). FINDINGS: Brain: Normal cerebral volume. No midline shift, ventriculomegaly, mass effect, evidence of mass lesion, intracranial hemorrhage or evidence of cortically based acute infarction. Gray-white matter differentiation is within normal limits throughout the brain. Vascular: No suspicious intracranial vascular hyperdensity. Skull: Extensive inflammatory periapical lucency about the left anterior maxillary dentition. Unerupted possible supernumerary right maxillary tooth. No skull fracture identified. Sinuses/Orbits: Mild maxillary alveolar recess mucosal thickening. Other paranasal sinuses, tympanic cavities and mastoids are clear. Other: No acute orbit or scalp soft tissue injury identified. IMPRESSION: 1. Normal noncontrast CT appearance of the brain. No acute traumatic injury identified. 2. Poor left maxillary dentition. Electronically Signed   By: Odessa Fleming M.D.   On: 04/29/2020 21:24   CT CERVICAL SPINE WO CONTRAST  Result Date: 04/29/2020 CLINICAL DATA:  24 year old male status post fall with pain. EXAM: CT CERVICAL SPINE WITHOUT CONTRAST TECHNIQUE: Multidetector CT imaging of the cervical spine was performed without intravenous contrast. Multiplanar CT image reconstructions were also generated. COMPARISON:  Head CT today reported separately. FINDINGS: Alignment: Mild straightening of cervical lordosis. Cervicothoracic junction alignment is within normal limits. Bilateral posterior element alignment is within normal limits. Skull base and vertebrae: Visualized skull base is intact. No atlanto-occipital  dissociation. Cervical vertebrae appear intact and normal. Soft tissues and spinal canal: No prevertebral fluid or swelling. No visible canal hematoma. Negative noncontrast visible neck soft tissues. Disc levels:  No degenerative changes. Upper chest: Thoracic spine today reported separately. Negative visible lung apices. Other: Poor posterior dentition, with pronounced erosion of the posterior body of the right mandible around a small fragment of the residual right posterior mandible molar (sagittal image 17). IMPRESSION: 1. No acute traumatic injury identified in the cervical spine. 2. CT Thoracic Spine reported separately. 3. Carious posterior dentition with pronounced erosion of the right posterior body of the mandible around a fragment of the posterior molar. Electronically Signed   By: Odessa Fleming M.D.   On: 04/29/2020 21:28   CT Thoracic Spine Wo Contrast  Result Date: 04/29/2020 CLINICAL DATA:  24 year old male status post fall with back pain. EXAM: CT THORACIC SPINE WITHOUT CONTRAST TECHNIQUE: Multidetector CT images of the thoracic were obtained using the standard protocol without intravenous contrast. COMPARISON:  Cervical spine CT today reported separately. FINDINGS:  Limited cervical spine imaging:  Reported separately. Thoracic spine segmentation:  Normal. Alignment: Mildly exaggerated upper thoracic kyphosis. Subtle dextroconvex thoracic scoliosis. Vertebrae: Visible posterior ribs appear intact. The L1 vertebra appears intact. There are mild superior endplate compression fractures of the T2 through T5 vertebral bodies (series 7, image 33). The pedicles and posterior elements of those levels remain intact. No retropulsion or other complicating features. The T6 through T12 vertebrae appear intact. 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 Lumbar spine is detailed separately today. Paraspinal and other soft tissues: Negative noncontrast  visible thoracic and upper abdominal viscera. Thoracic paraspinal soft tissues are within normal limits. Disc levels: No evidence of disc degeneration. No CT evidence of thoracic spinal stenosis. IMPRESSION: 1. Mild superior endplate compression fractures of T2 through T5, presumably acute in this setting. No retropulsion or other complicating features. Noncontrast Thoracic Spine MRI would confirm acuity if necessary. 2. No other acute traumatic injury identified in the thoracic spine. 3. CT Lumbar Spine today reported separately. Electronically Signed   By: Odessa Fleming M.D.   On: 04/29/2020 21:34   CT Lumbar Spine Wo Contrast  Result Date: 04/29/2020 CLINICAL DATA:  24 year old male status post fall with back pain. EXAM: CT LUMBAR SPINE WITHOUT CONTRAST TECHNIQUE: Multidetector CT imaging of the lumbar spine was performed without intravenous contrast administration. Multiplanar CT image reconstructions were also generated. COMPARISON:  Thoracic spine CT today reported separately. CT Abdomen and Pelvis 02/11/2004. FINDINGS: Segmentation: Normal, concordant with the thoracic spine numbering today. Alignment: Mild straightening of lumbar lordosis. Vertebrae: Lumbar vertebrae appear intact. 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 Spina bifida occulta at S1 (normal variant). Intact visible sacrum and pelvis. Visible lower ribs appear intact. Paraspinal and other soft tissues: Lumbar paraspinal soft tissues are within normal limits. Negative visible noncontrast abdominal and pelvic viscera. Disc levels: No degenerative changes or CT evidence of lumbar spinal stenosis. IMPRESSION: No acute traumatic injury identified in the lumbar spine. Electronically Signed   By: Odessa Fleming M.D.   On: 04/29/2020 21:38   DG Pelvis Portable  Result Date: 04/29/2020 CLINICAL DATA:  Fall in shower EXAM: PORTABLE PELVIS 1-2 VIEWS COMPARISON:  11/19/2019 FINDINGS: There is no evidence of pelvic fracture or  diastasis. No pelvic bone lesions are seen. IMPRESSION: Negative. Electronically Signed   By: Deatra Robinson M.D.   On: 04/29/2020 20:38    Procedures Procedures (including critical care time)  Medications Ordered in ED Medications  fentaNYL (SUBLIMAZE) injection 50 mcg (50 mcg Intravenous Given 04/29/20 2301)    ED Course  I have reviewed the triage vital signs and the nursing notes.  Pertinent labs & imaging results that were available during my care of the patient were reviewed by me and considered in my medical decision making (see chart for details).    MDM Rules/Calculators/A&P                          Labs unremarkable.  Pelvic fracture seen on x-ray.  No injuries on CT head or CT C-spine.  Patient does have T2 through T5 superior endplate compression fractures.  Plan to obtain MRI to further evaluate.  In the time that he has been in the ED, patient has return of motor and sensory function of his right leg.  He now has motor and sensory function of his left upper leg, although he states he still cannot feel anything  or move the lower part of his left leg.  Reevaluate after MRI for final disposition.  This patient was seen with Dr. Anitra LauthPlunkett. Final Clinical Impression(s) / ED Diagnoses Final diagnoses:  Fall  Compression fracture of T2 vertebra, initial encounter (HCC)  Compression fracture of T3 vertebra, initial encounter (HCC)  Compression fracture of T4 vertebra, initial encounter (HCC)  Compression fracture of T5 vertebra, initial encounter [facility](HCC)    Rx / DC Orders ED Discharge Orders    None       Allayne ButcherMorrone, Arnol Mcgibbon, MD 04/30/20 [id number]    Gwyneth SproutPlunkett, Whitney, MD 05/01/20 2150

## 2020-04-30 ENCOUNTER — Emergency Department (HOSPITAL_COMMUNITY): Payer: Self-pay

## 2020-04-30 IMAGING — DX DG TIBIA/FIBULA 2V*L*
1 series · 3 of 3 positions shown · non-contrast
Comparison: None.

CLINICAL DATA: Pain status post fall

EXAM:
LEFT FEMUR 2 VIEWS; LEFT TIBIA AND FIBULA - 2 VIEW

[Series 1: leg · 0.14mm/px · 3 of 3 slices shown]
[im 1/3]
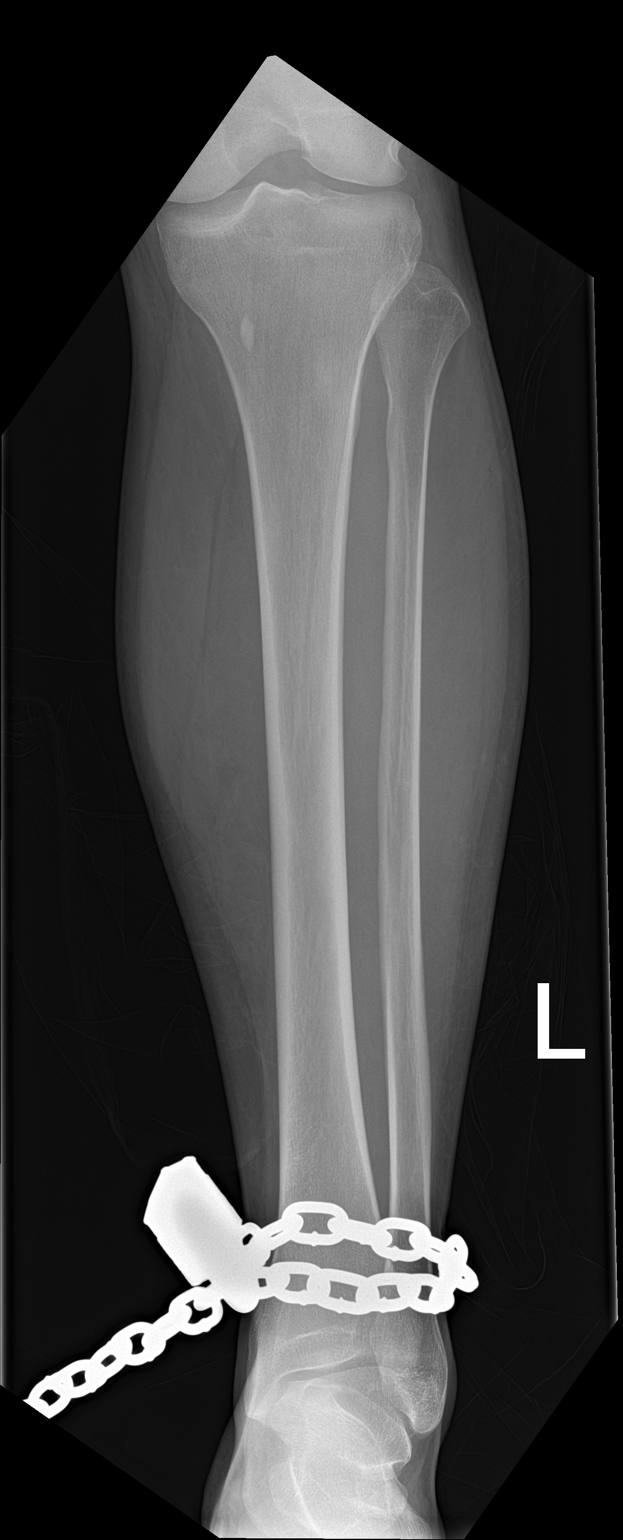
[im 2/3]
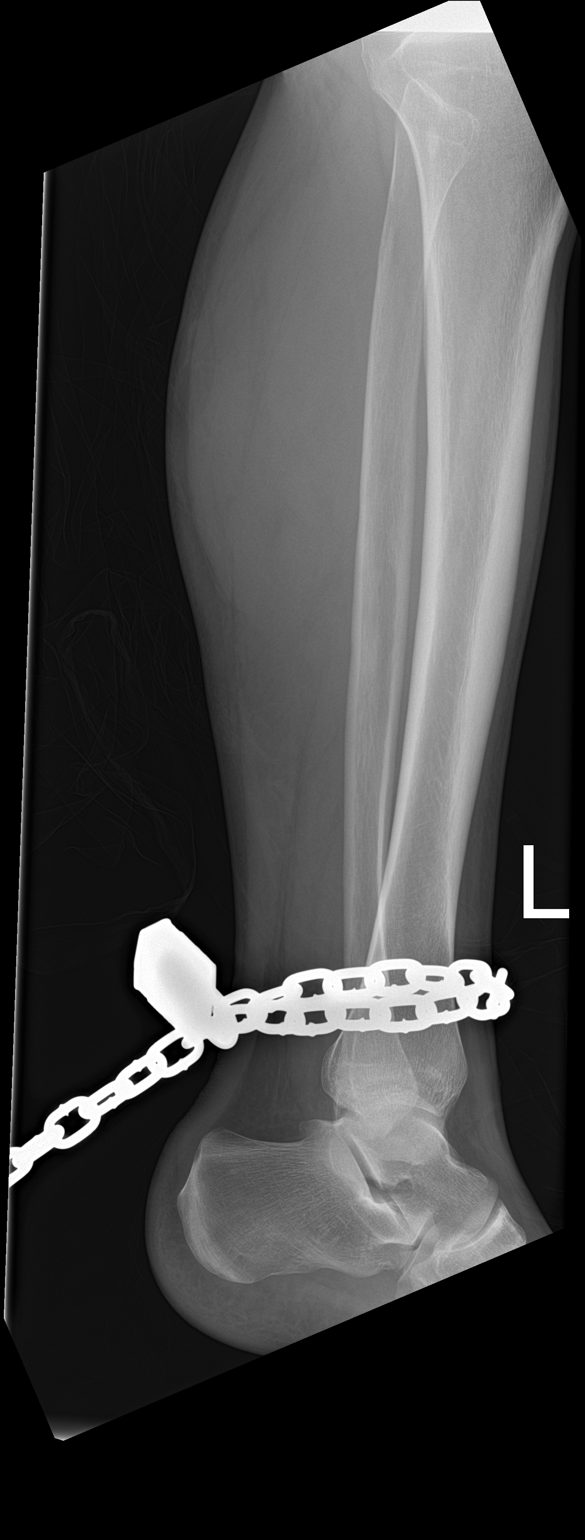
[im 3/3]
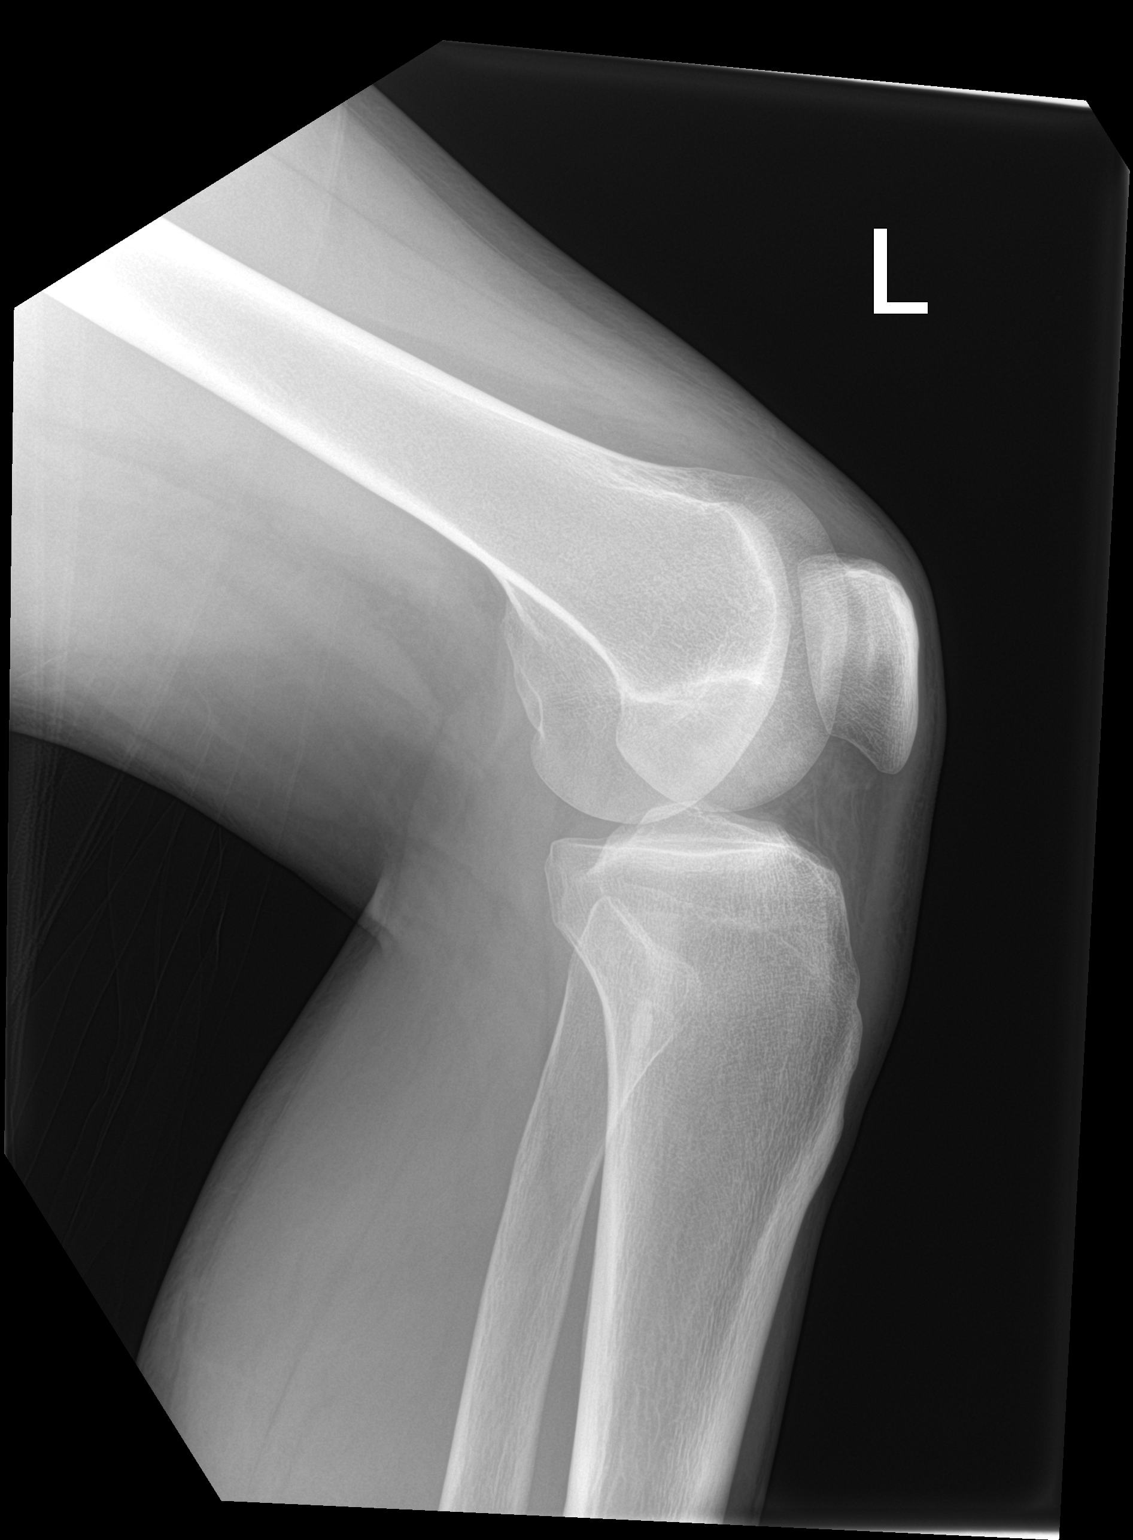

[3 of 3 positions shown; findings below may reference images not displayed]

FINDINGS: There is no evidence of fracture or other focal bone lesions. Soft
tissues are unremarkable.
IMPRESSION: Negative.

## 2020-04-30 IMAGING — DX DG FEMUR 2+V*L*
1 series · 4 of 4 positions shown · non-contrast
Comparison: None.

CLINICAL DATA: Pain status post fall

EXAM:
LEFT FEMUR 2 VIEWS; LEFT TIBIA AND FIBULA - 2 VIEW

[Series 1: femur · 0.14mm/px · 4 of 4 slices shown]
[im 1/4]
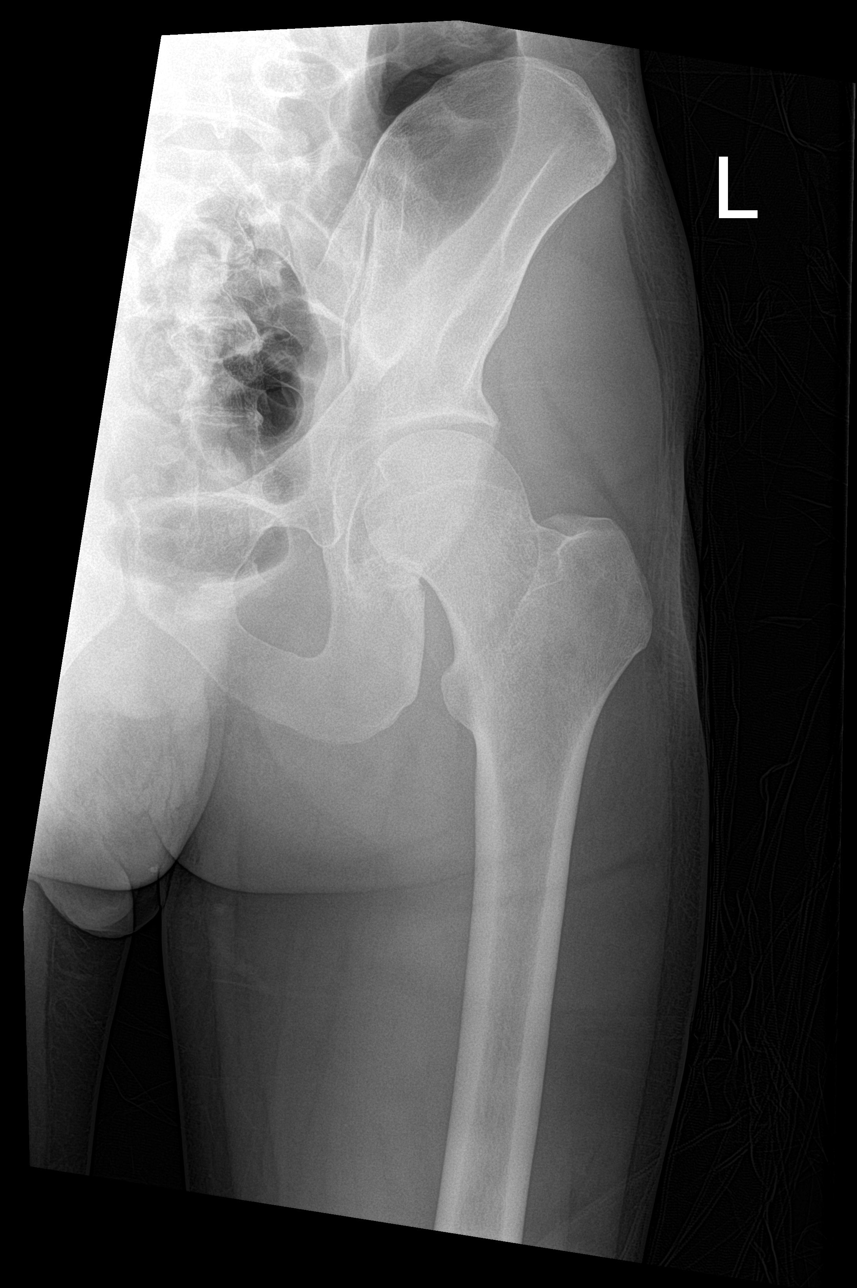
[im 2/4]
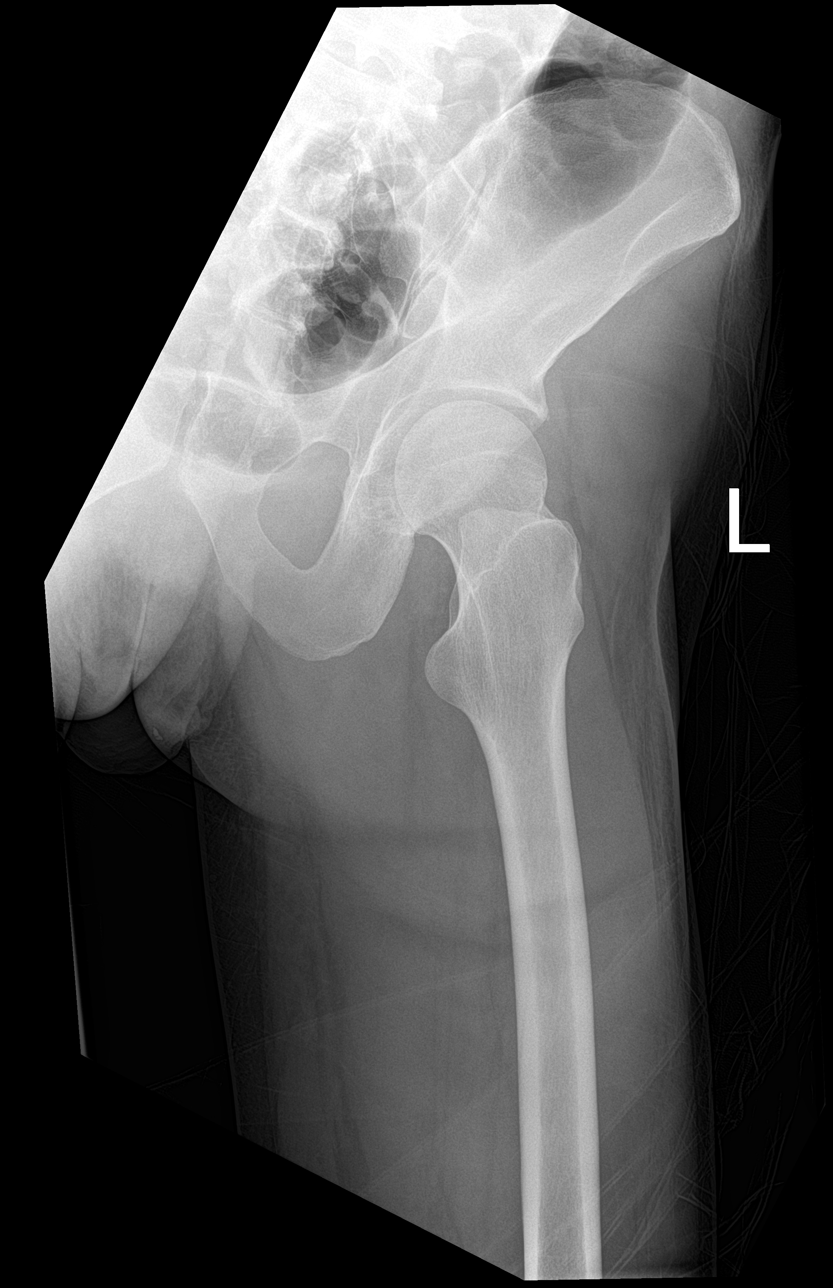
[im 3/4]
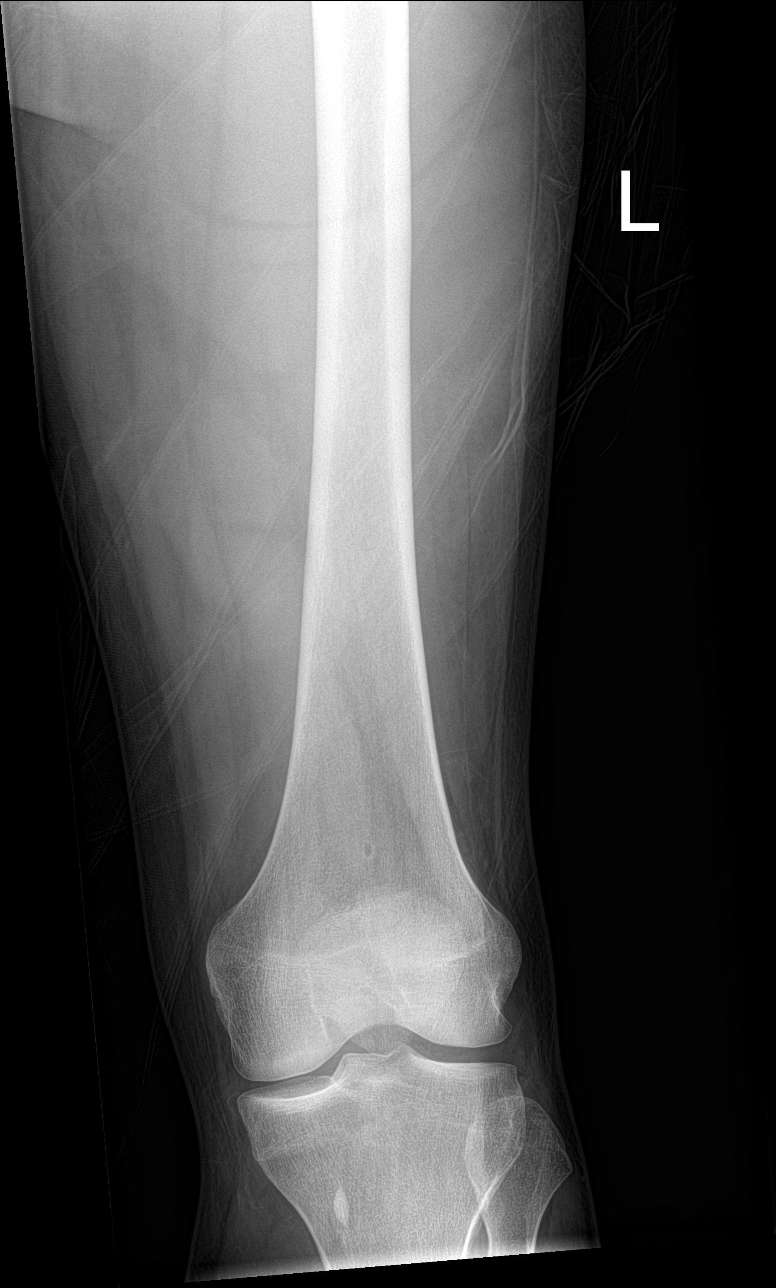
[im 4/4]
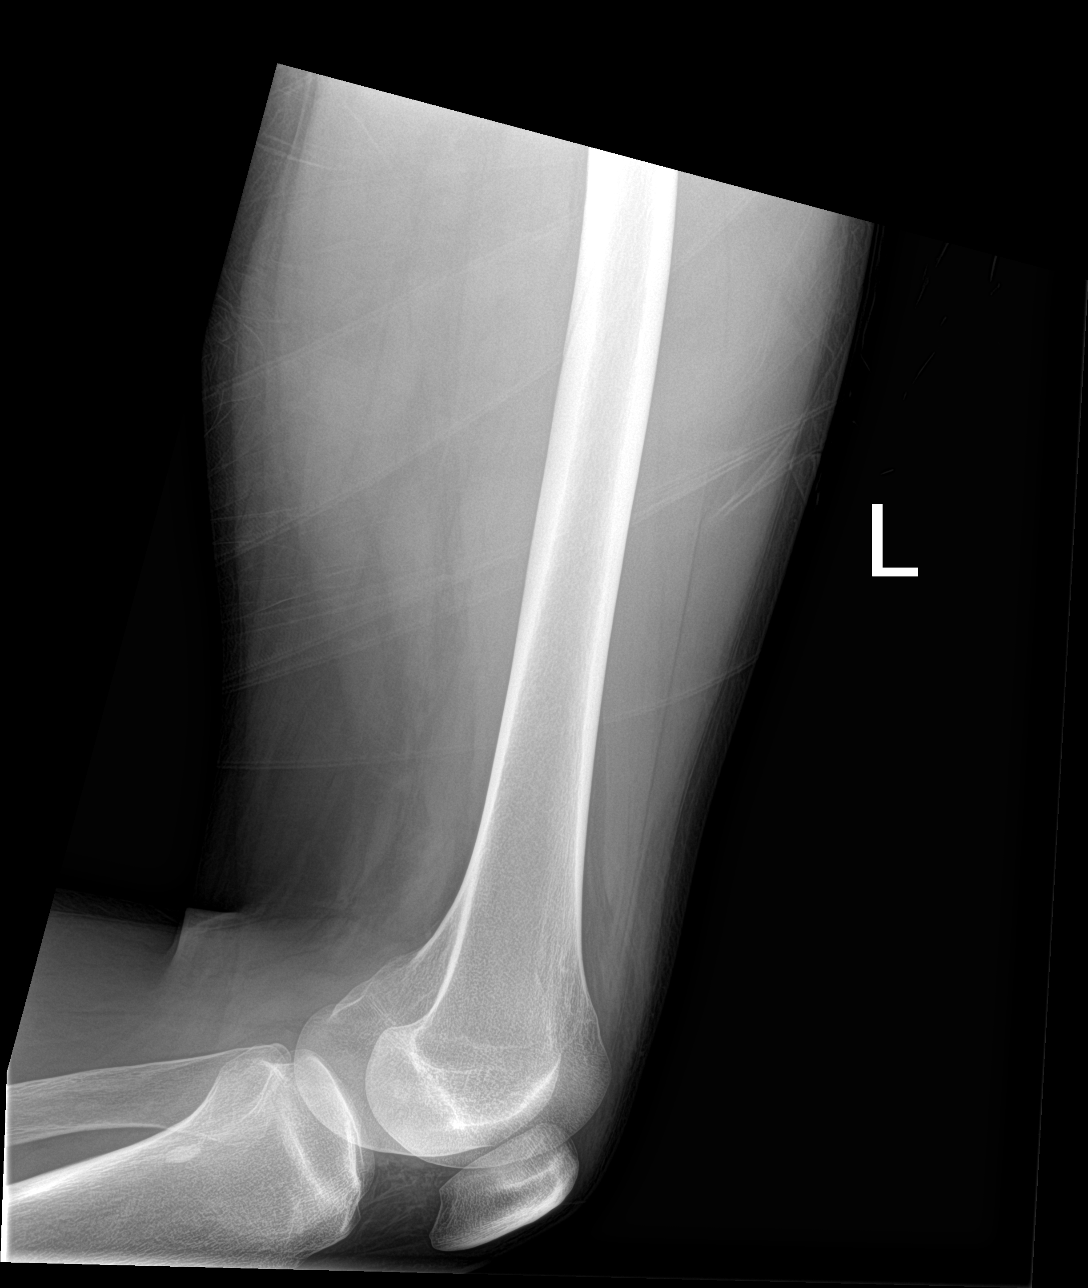

[4 of 4 positions shown; findings below may reference images not displayed]

FINDINGS: There is no evidence of fracture or other focal bone lesions. Soft
tissues are unremarkable.
IMPRESSION: Negative.

## 2020-04-30 MED ORDER — KETOROLAC TROMETHAMINE 15 MG/ML IJ SOLN
15.0000 mg | Freq: Once | INTRAMUSCULAR | Status: AC
  Administered 2020-04-30: 15 mg via INTRAVENOUS
  Filled 2020-04-30: qty 1
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
## 2020-04-30 NOTE — ED Notes (Signed)
Patient resting peacefully on stretcher with HOB elevated. Officers remain at bedside. NAD noted.

## 2020-04-30 NOTE — Discharge Instructions (Addendum)
If you develop worsening, recurrent, or continued back pain, numbness or weakness in the legs, incontinence of your bowels or bladders, numbness of your buttocks, fever, abdominal pain, or any other new/concerning symptoms then return to the ER for evaluation.   You may take ibuprofen and/or Tylenol for pain.

## 2020-05-01 ENCOUNTER — Encounter (HOSPITAL_COMMUNITY): Payer: Self-pay | Admitting: Emergency Medicine

## 2020-05-10 ENCOUNTER — Other Ambulatory Visit: Payer: Self-pay

## 2020-05-10 ENCOUNTER — Emergency Department (HOSPITAL_COMMUNITY)
Admission: EM | Admit: 2020-05-10 | Discharge: 2020-05-11 | Disposition: A | Discharge status: Home / Self Care | Payer: Self-pay | Attending: Emergency Medicine | Admitting: Emergency Medicine

## 2020-05-10 ENCOUNTER — Encounter (HOSPITAL_COMMUNITY): Payer: Self-pay | Admitting: Emergency Medicine

## 2020-05-10 DIAGNOSIS — T40601A Poisoning by unspecified narcotics, accidental (unintentional), initial encounter: Secondary | ICD-10-CM

## 2020-05-10 DIAGNOSIS — T402X1A Poisoning by other opioids, accidental (unintentional), initial encounter: Secondary | ICD-10-CM | POA: Insufficient documentation

## 2020-05-10 DIAGNOSIS — G4710 Hypersomnia, unspecified: Secondary | ICD-10-CM | POA: Insufficient documentation
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
## 2020-05-11 MED ORDER — ACETAMINOPHEN 500 MG PO TABS
1000.0000 mg | ORAL_TABLET | Freq: Once | ORAL | Status: AC
  Administered 2020-05-11: 1000 mg via ORAL
  Filled 2020-05-11: qty 2

## 2020-05-11 MED ORDER — NALOXONE HCL 4 MG/0.1ML NA LIQD: NASAL | 0 refills | Status: AC

## 2020-05-11 NOTE — ED Provider Notes (Signed)
[facility] EMERGENCY DEPARTMENT Provider Note   CSN: [id number] Arrival date & time: 05/10/20  2247     History No chief complaint on file.   Aaron Holder is a 24 y.o. male.  Unclear why the patient is actually here.  He is obviously intoxicated.  He is protecting his airway.  He initially denied any drugs or alcohol seen just out of jail and he was tired of sleeping so he stayed awake for 2 days and now is sleepy.  Ultimately after noticing tract marks on his arm he admitted to heroin use.  Does not complain of any pain.  Does not complain of any other abnormalities.  No nausea or vomiting.   Altered Mental Status Presenting symptoms comment:  Sleepy      Past Medical History:  Diagnosis Date  . ADHD (attention deficit hyperactivity disorder)   . Bipolar 1 disorder (HCC)   . Seizures (HCC)     There are no problems to display for this patient.   Past Surgical History:  Procedure Laterality Date  . APPENDECTOMY    . HERNIA REPAIR         History reviewed. No pertinent family history.  Social History   Tobacco Use  . Smoking status: Never Smoker  . Smokeless tobacco: Never Used  Substance Use Topics  . Alcohol use: Yes  . Drug use: Not Currently    Home Medications Prior to Admission medications   Medication Sig Start Date End Date Taking? Authorizing Provider  cephALEXin (KEFLEX) 500 MG capsule Take 1 capsule (500 mg total) by mouth 4 (four) times daily. 03/20/17   Earley Favor, NP  HYDROcodone-acetaminophen (NORCO/VICODIN) 5-325 MG tablet Take 1 tablet by mouth every 6 (six) hours as needed for severe pain. 03/20/17   Earley Favor, NP  ibuprofen (ADVIL,MOTRIN) 800 MG tablet Take 1 tablet (800 mg total) by mouth 3 (three) times daily. 02/23/16   Dan Humphreys, MD  mupirocin nasal ointment Idelle Jo) 2 % Apply in each nostril daily 03/20/17   Earley Favor, NP  naproxen (NAPROSYN) 500 MG tablet Take 1 tablet (500 mg total) by mouth 2 (two) times daily.  03/18/16   Felicie Morn, NP  penicillin v potassium (VEETID) 500 MG tablet Take 1 tablet (500 mg total) by mouth 3 (three) times daily. 03/18/16   Felicie Morn, NP    Allergies    Patient has no known allergies.  Review of Systems   Review of Systems  All other systems reviewed and are negative.   Physical Exam Updated Vital Signs BP 120/65   Pulse 89   Temp 98.6 F (37 C) (Oral)   Resp 16   Ht 5\' 9"  (1.753 m)   Wt 68 kg   SpO2 99%   BMI 22.15 kg/m   Physical Exam Vitals and nursing note reviewed.  Constitutional:      Appearance: He is well-developed.     Comments: Sleepy but alert to voice  HENT:     Head: Normocephalic and atraumatic.     Nose: No congestion or rhinorrhea.     Mouth/Throat:     Mouth: Mucous membranes are moist.     Pharynx: Oropharynx is clear.  Eyes:     Pupils: Pupils are equal, round, and reactive to light.  Cardiovascular:     Rate and Rhythm: Normal rate.  Pulmonary:     Effort: Pulmonary effort is normal. No respiratory distress.     Breath sounds: No wheezing.  Abdominal:  General: Abdomen is flat. There is no distension.  Musculoskeletal:        General: Normal range of motion.     Cervical back: Normal range of motion.  Skin:    General: Skin is warm and dry.     Coloration: Skin is not jaundiced or pale.  Neurological:     General: No focal deficit present.     Mental Status: He is alert.     ED Results / Procedures / Treatments   Labs (all labs ordered are listed, but only abnormal results are displayed) Labs Reviewed - No data to display  EKG None  Radiology No results found.  Procedures Procedures (including critical care time)  Medications Ordered in ED Medications  acetaminophen (TYLENOL) tablet 1,000 mg (has no administration in time range)    ED Course  I have reviewed the triage vital signs and the nursing notes.  Pertinent labs & imaging results that were available during my care of the patient  were reviewed by me and considered in my medical decision making (see chart for details).    MDM Rules/Calculators/A&P                         Suspect opiate toxicity but is protecting airway and is arousable so we will hold on Narcan at this time.  Denies any other drug however patient is obviously not very forthcoming.  Will reevaluate when he sobers up.  Still sleepy. Tolerating PO fluids. Ambulates without difficulty. Has a headache. Tylenol ordered. Will get snack. If tolerates ok, will dc.   Final Clinical Impression(s) / ED Diagnoses Final diagnoses:  None    Rx / DC Orders ED Discharge Orders    None       Jony Ladnier, Barbara Cower, MD 05/11/20 [phone]

## 2020-05-11 NOTE — ED Notes (Signed)
Pt resting comfortably at this time. Equal rise and fall of the chest noted.

## 2020-05-11 NOTE — ED Notes (Signed)
Pt ambulated in hallway at this time without any issues . Pt is  also having a snack at this time  Dr.Mesner aware .Runette Scifres

## 2020-05-12 ENCOUNTER — Encounter (HOSPITAL_COMMUNITY): Payer: Self-pay | Admitting: Emergency Medicine

## 2020-05-12 ENCOUNTER — Emergency Department (HOSPITAL_COMMUNITY)
Admission: EM | Admit: 2020-05-12 | Discharge: 2020-05-13 | Disposition: A | Discharge status: Home / Self Care | Payer: Self-pay | Attending: Emergency Medicine | Admitting: Emergency Medicine

## 2020-05-12 ENCOUNTER — Other Ambulatory Visit: Payer: Self-pay

## 2020-05-12 DIAGNOSIS — M79662 Pain in left lower leg: Secondary | ICD-10-CM | POA: Insufficient documentation

## 2020-05-12 DIAGNOSIS — M546 Pain in thoracic spine: Secondary | ICD-10-CM | POA: Insufficient documentation

## 2020-05-12 DIAGNOSIS — M79605 Pain in left leg: Secondary | ICD-10-CM

## 2020-05-12 NOTE — ED Notes (Signed)
Pt called for triage and unable to locate.  
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
## 2020-05-12 NOTE — ED Notes (Signed)
No answer for vitals recheck x1 and not visible in lobby 

## 2020-05-13 ENCOUNTER — Emergency Department (HOSPITAL_BASED_OUTPATIENT_CLINIC_OR_DEPARTMENT_OTHER): Payer: Self-pay

## 2020-05-13 DIAGNOSIS — M7989 Other specified soft tissue disorders: Secondary | ICD-10-CM

## 2020-05-13 DIAGNOSIS — M79609 Pain in unspecified limb: Secondary | ICD-10-CM

## 2020-05-13 MED ORDER — KETOROLAC TROMETHAMINE 30 MG/ML IJ SOLN
30.0000 mg | Freq: Once | INTRAMUSCULAR | Status: AC
  Administered 2020-05-13: 30 mg via INTRAMUSCULAR
  Filled 2020-05-13: qty 1

## 2020-05-13 MED ORDER — METHOCARBAMOL 500 MG PO TABS: 500.0000 mg | ORAL_TABLET | Freq: Two times a day (BID) | ORAL | 0 refills | Status: AC

## 2020-05-13 MED ORDER — METHOCARBAMOL 500 MG PO TABS
500.0000 mg | ORAL_TABLET | Freq: Once | ORAL | Status: AC
  Administered 2020-05-13: 500 mg via ORAL
  Filled 2020-05-13: qty 1

## 2020-05-13 MED ORDER — NAPROXEN 500 MG PO TABS: 500.0000 mg | ORAL_TABLET | Freq: Two times a day (BID) | ORAL | 0 refills | Status: DC

## 2020-05-13 MED ORDER — LIDOCAINE 5 % EX PTCH
1.0000 | MEDICATED_PATCH | Freq: Once | CUTANEOUS | Status: DC
  Administered 2020-05-13: 1 via TRANSDERMAL
  Filled 2020-05-13: qty 1

## 2020-05-13 NOTE — Discharge Instructions (Signed)
Take medications as prescribed to help with your pain, you can use Tylenol in addition to these 2 medicines.  Robaxin can cause some drowsiness, do not take before driving.  Please follow-up with orthopedics for further evaluation of your back and leg pain.  Your DVT study was negative today, we do not see any signs of blood clot.
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
## 2020-05-13 NOTE — Progress Notes (Signed)
VASCULAR LAB    Left lower extremity venous duplex has been performed.  See CV proc for preliminary results.  Messaged results to Jodi Geralds, PA-C   Nitesh Pitstick, [facility], RVT 05/13/2020, 8:52 AM

## 2020-05-13 NOTE — ED Notes (Signed)
AVS reviewed with pt who verbalized understanding. Ambulatory out of dept.  

## 2021-01-29 ENCOUNTER — Emergency Department (HOSPITAL_COMMUNITY)
Admission: EM | Admit: 2021-01-29 | Discharge: 2021-01-29 | Disposition: A | Discharge status: Home / Self Care | Payer: Self-pay | Attending: Emergency Medicine | Admitting: Emergency Medicine

## 2021-01-29 ENCOUNTER — Other Ambulatory Visit: Payer: Self-pay

## 2021-01-29 ENCOUNTER — Emergency Department (HOSPITAL_BASED_OUTPATIENT_CLINIC_OR_DEPARTMENT_OTHER): Payer: Self-pay

## 2021-01-29 ENCOUNTER — Emergency Department (HOSPITAL_COMMUNITY): Payer: Self-pay

## 2021-01-29 ENCOUNTER — Encounter (HOSPITAL_COMMUNITY): Payer: Self-pay

## 2021-01-29 DIAGNOSIS — Z86718 Personal history of other venous thrombosis and embolism: Secondary | ICD-10-CM

## 2021-01-29 DIAGNOSIS — M79662 Pain in left lower leg: Secondary | ICD-10-CM | POA: Insufficient documentation

## 2021-01-29 DIAGNOSIS — M7989 Other specified soft tissue disorders: Secondary | ICD-10-CM

## 2021-01-29 LAB — CBC
HCT: 39.7 % (ref 39.0–52.0)
Hemoglobin: 12.6 g/dL — ABNORMAL LOW (ref 13.0–17.0)
MCH: 29 pg (ref 26.0–34.0)
MCHC: 31.7 g/dL (ref 30.0–36.0)
MCV: 91.3 fL (ref 80.0–100.0)
Platelets: 250 10*3/uL (ref 150–400)
RBC: 4.35 MIL/uL (ref 4.22–5.81)
RDW: 13.9 % (ref 11.5–15.5)
WBC: 6.9 10*3/uL (ref 4.0–10.5)
nRBC: 0 % (ref 0.0–0.2)

## 2021-01-29 LAB — BASIC METABOLIC PANEL
Anion gap: 7 (ref 5–15)
BUN: 14 mg/dL (ref 6–20)
CO2: 31 mmol/L (ref 22–32)
Calcium: 9.5 mg/dL (ref 8.9–10.3)
Chloride: 101 mmol/L (ref 98–111)
Creatinine, Ser: 0.83 mg/dL (ref 0.61–1.24)
GFR, Estimated: >60 mL/min (ref >60)
Glucose, Bld: 110 mg/dL — ABNORMAL HIGH (ref 70–99)
Potassium: 4.1 mmol/L (ref 3.5–5.1)
Sodium: 139 mmol/L (ref 135–145)

## 2021-01-29 IMAGING — DX DG ANKLE COMPLETE 3+V*L*
3 series · 3 of 3 positions shown · non-contrast
Comparison: None.

CLINICAL DATA: Patient complains of left lower swelling x 3 days
with pain. Hx DVT; never took blood thinners. Patient denies SOB.

EXAM:
LEFT ANKLE COMPLETE - 3+ VIEW

[ankle ap]
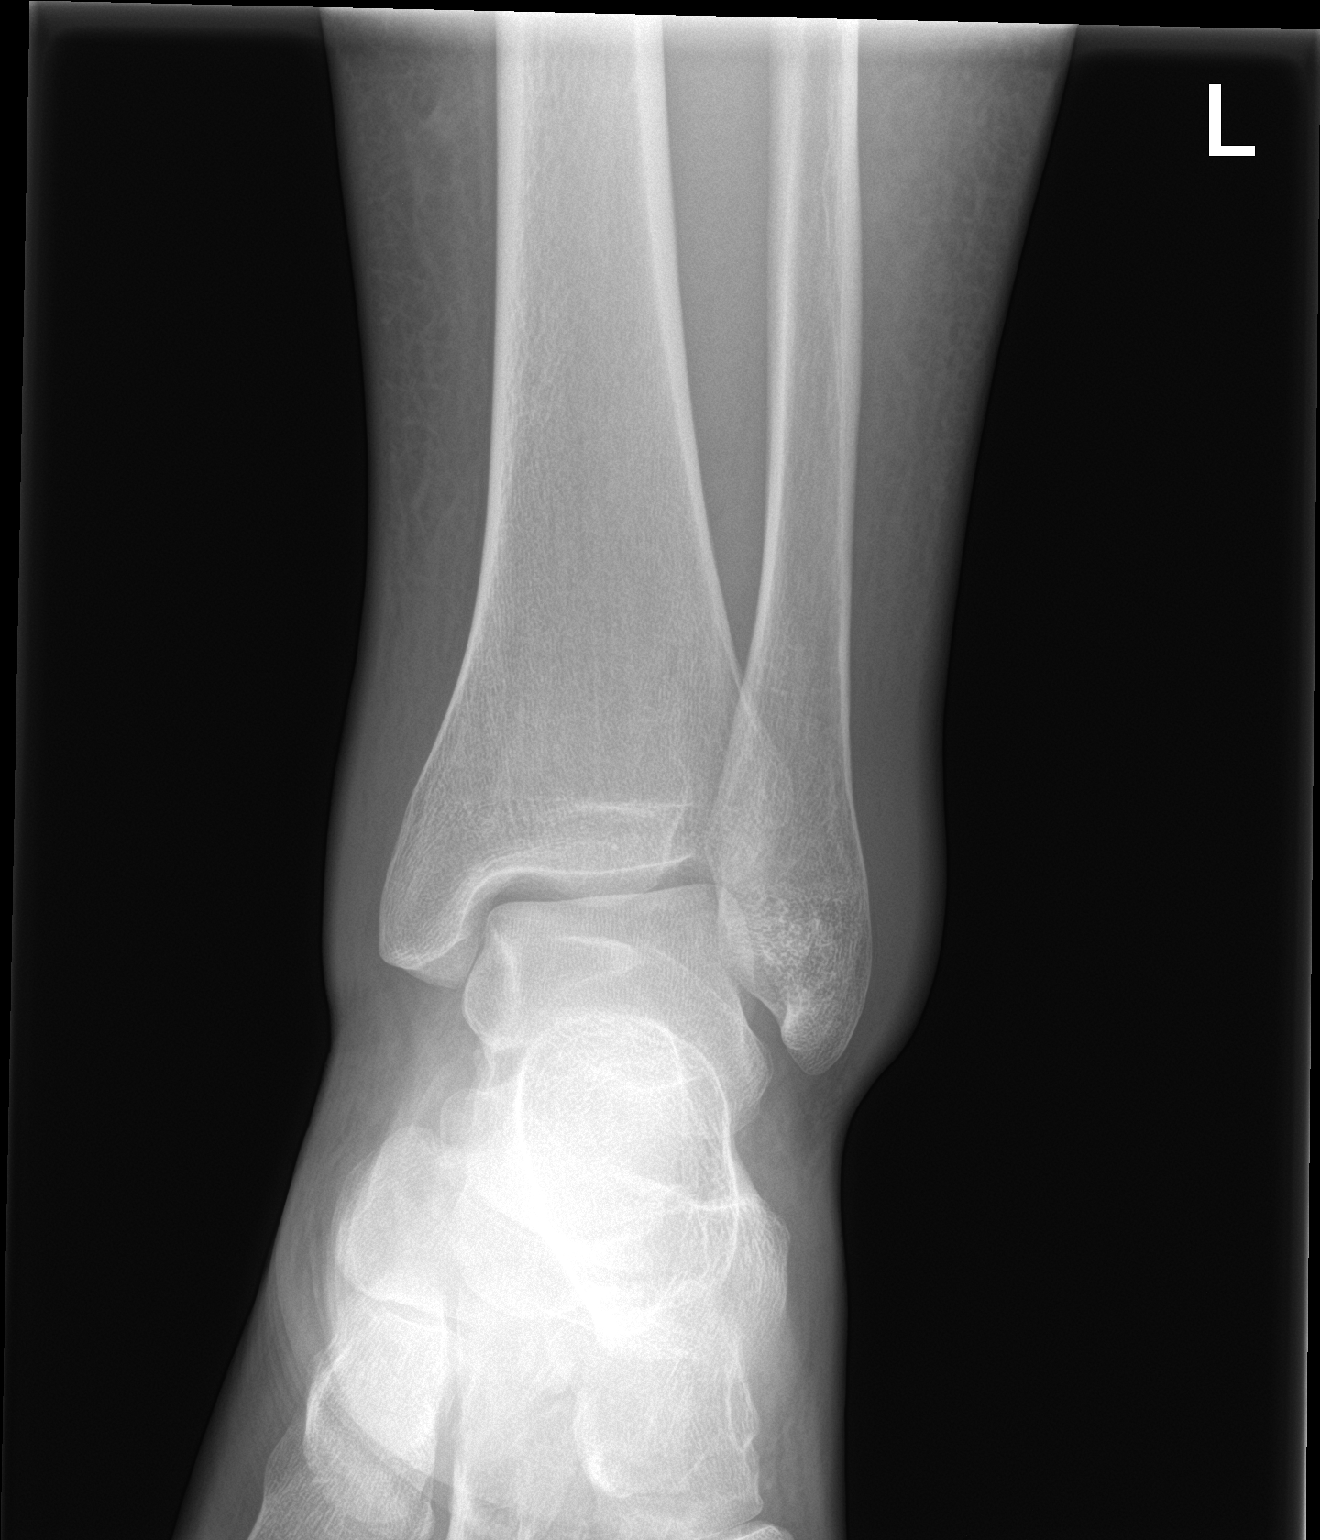

[ankle obl]
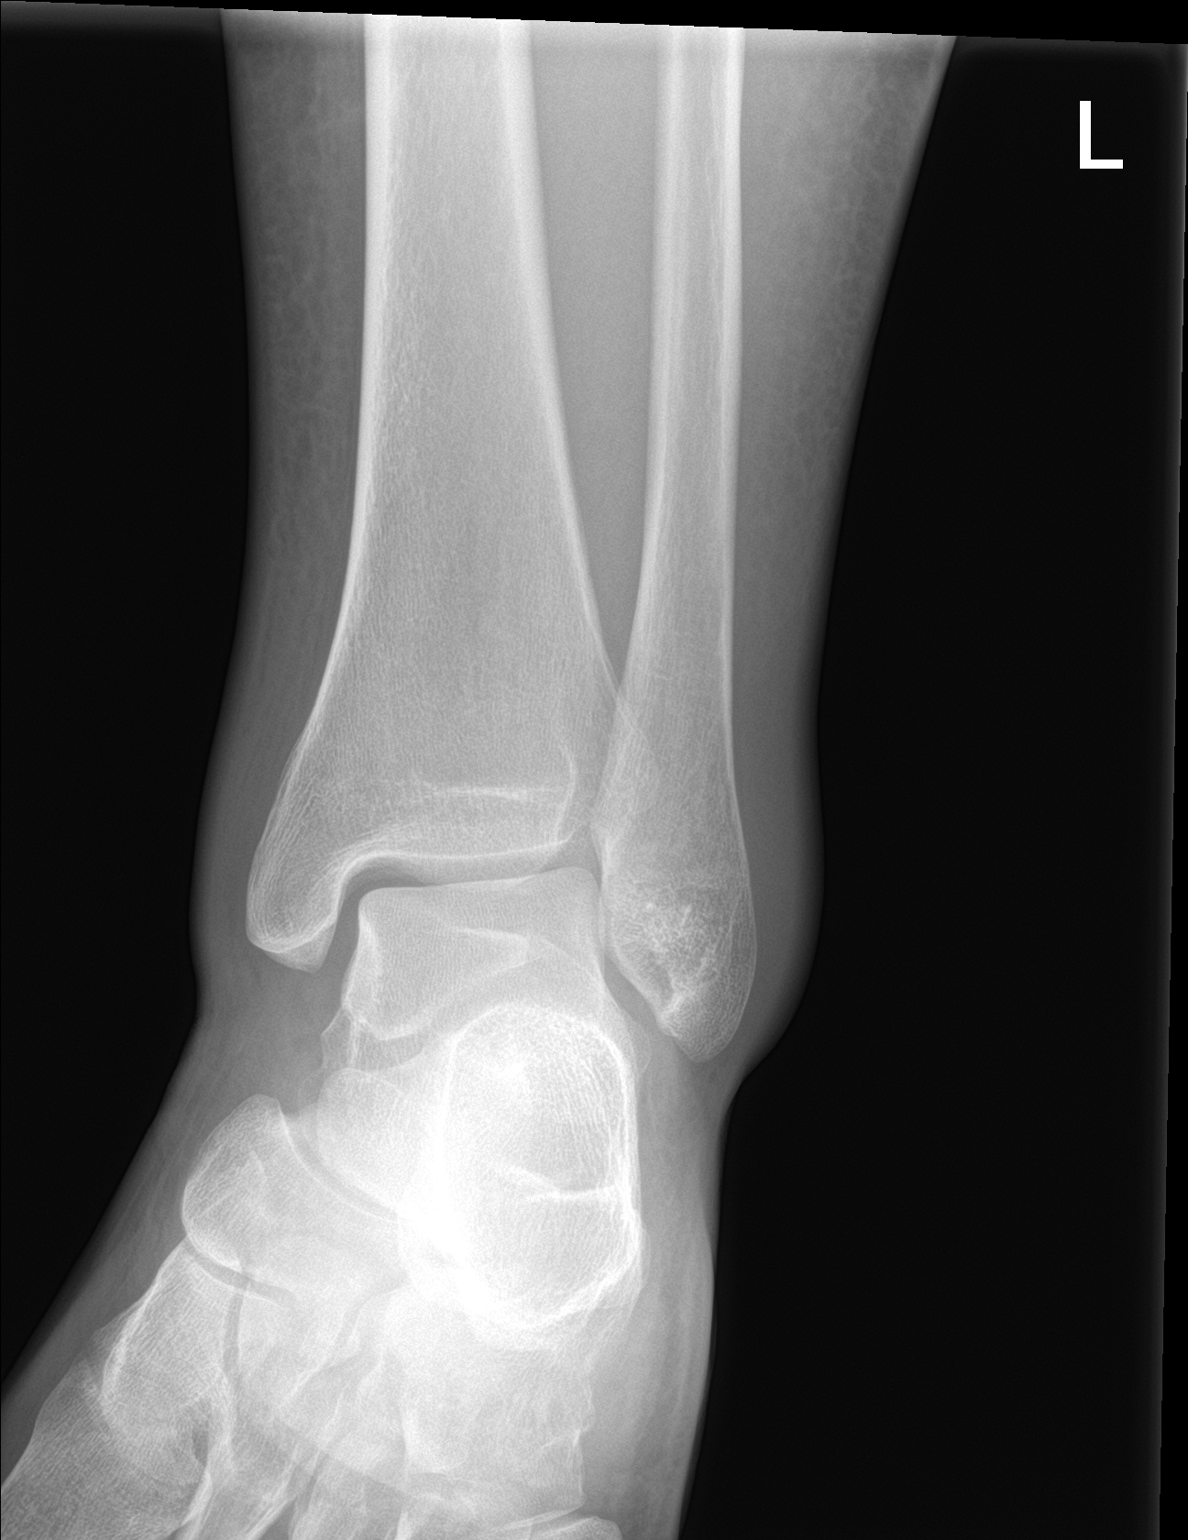

[ankle lat]
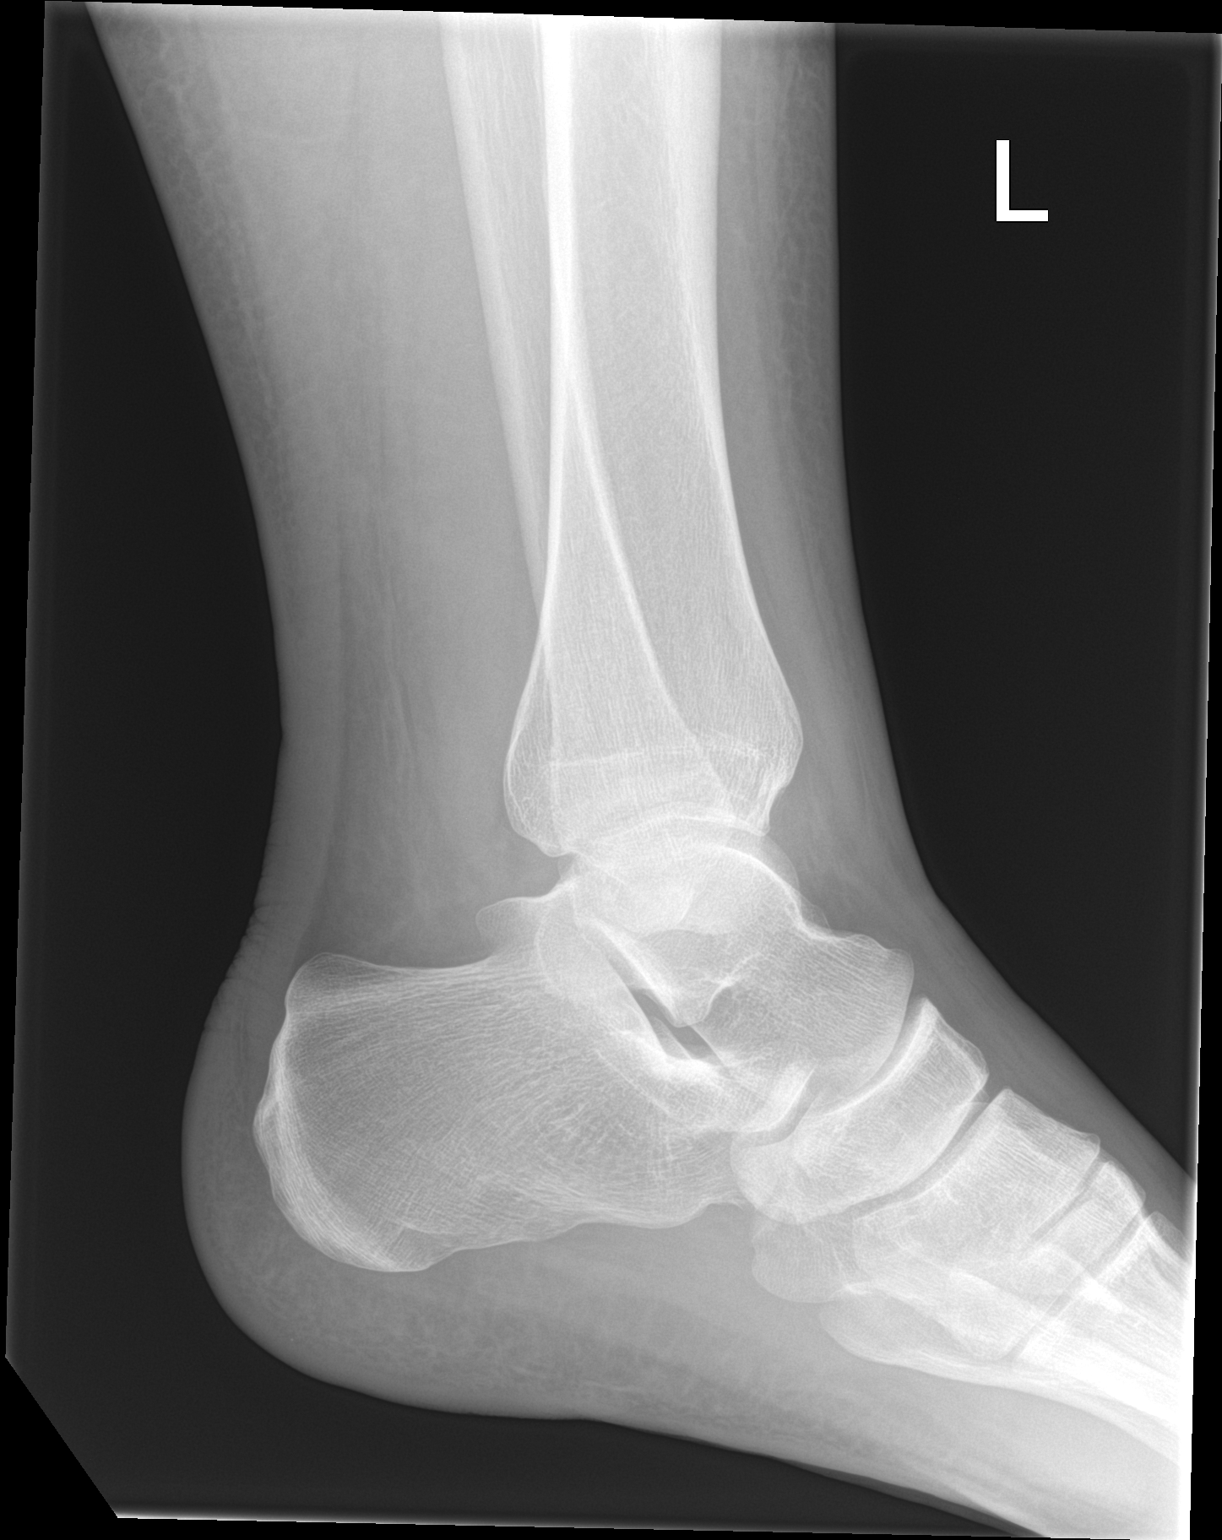

[3 of 3 positions shown; findings below may reference images not displayed]

FINDINGS: No fracture or bone lesion.

Ankle joint normally spaced and aligned. No
degenerative/arthropathic change.

Nonspecific diffuse soft tissue swelling.
IMPRESSION: 1. No fracture or ankle joint abnormality.
2. Nonspecific soft tissue swelling.

## 2021-01-29 IMAGING — DX DG TIBIA/FIBULA 2V*L*
4 series · 4 of 4 positions shown · non-contrast
Comparison: None.

CLINICAL DATA: Patient complains of left lower swelling x 3 days
with pain. Hx DVT; never took blood thinners. Patient denies SOB.

EXAM:
LEFT TIBIA AND FIBULA - 2 VIEW

[tibia ap (1 of 2)]
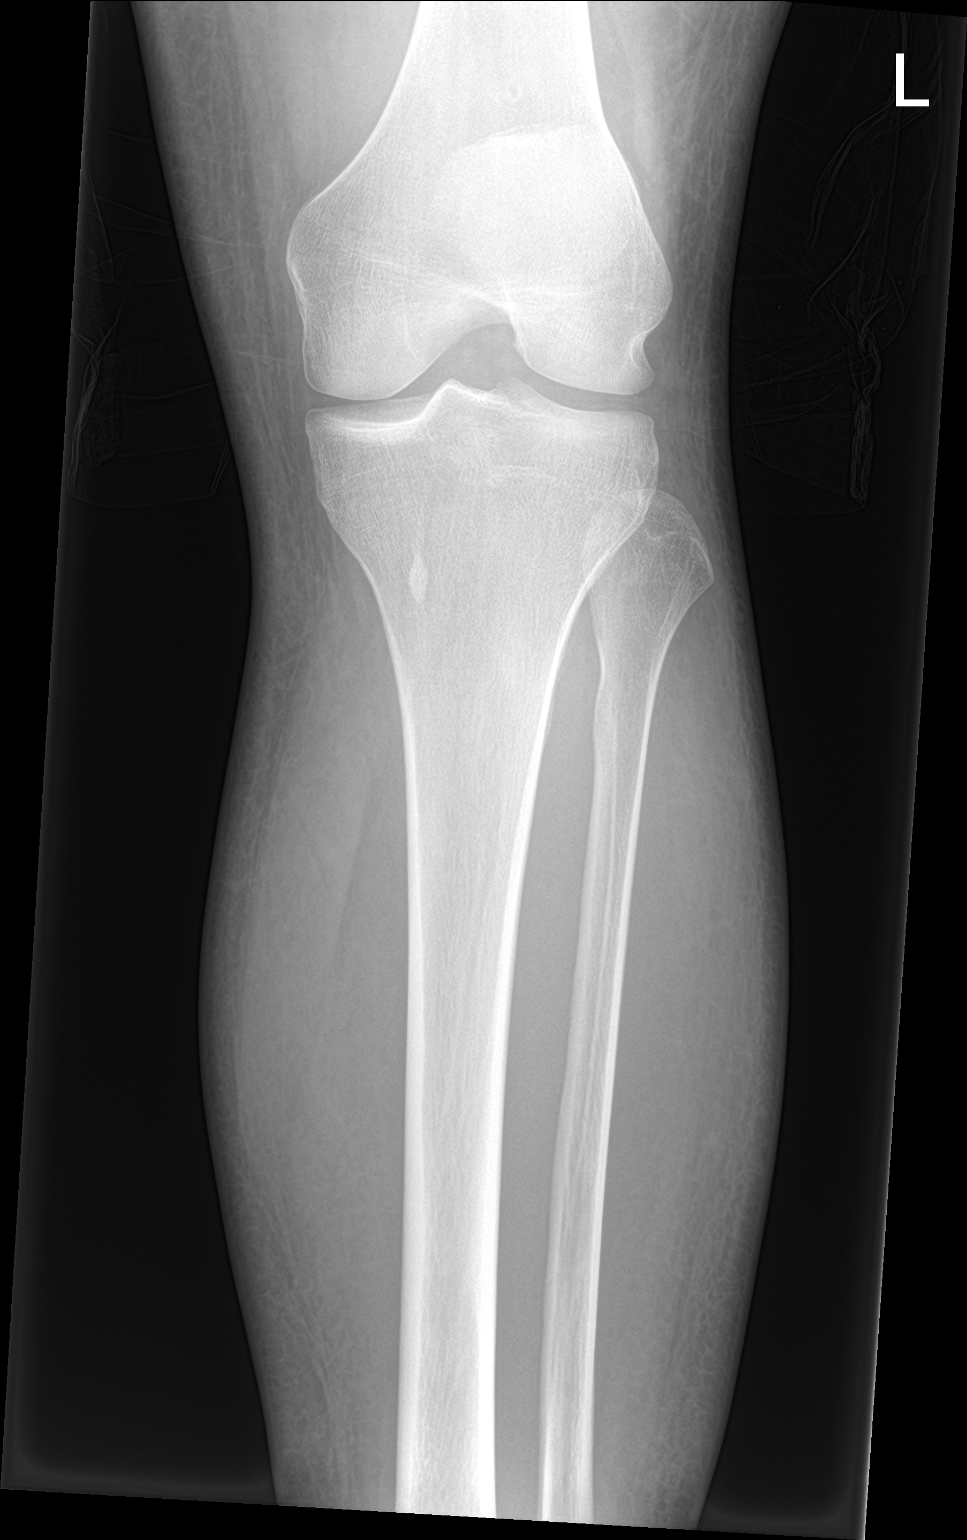

[tibia ap (2 of 2)]
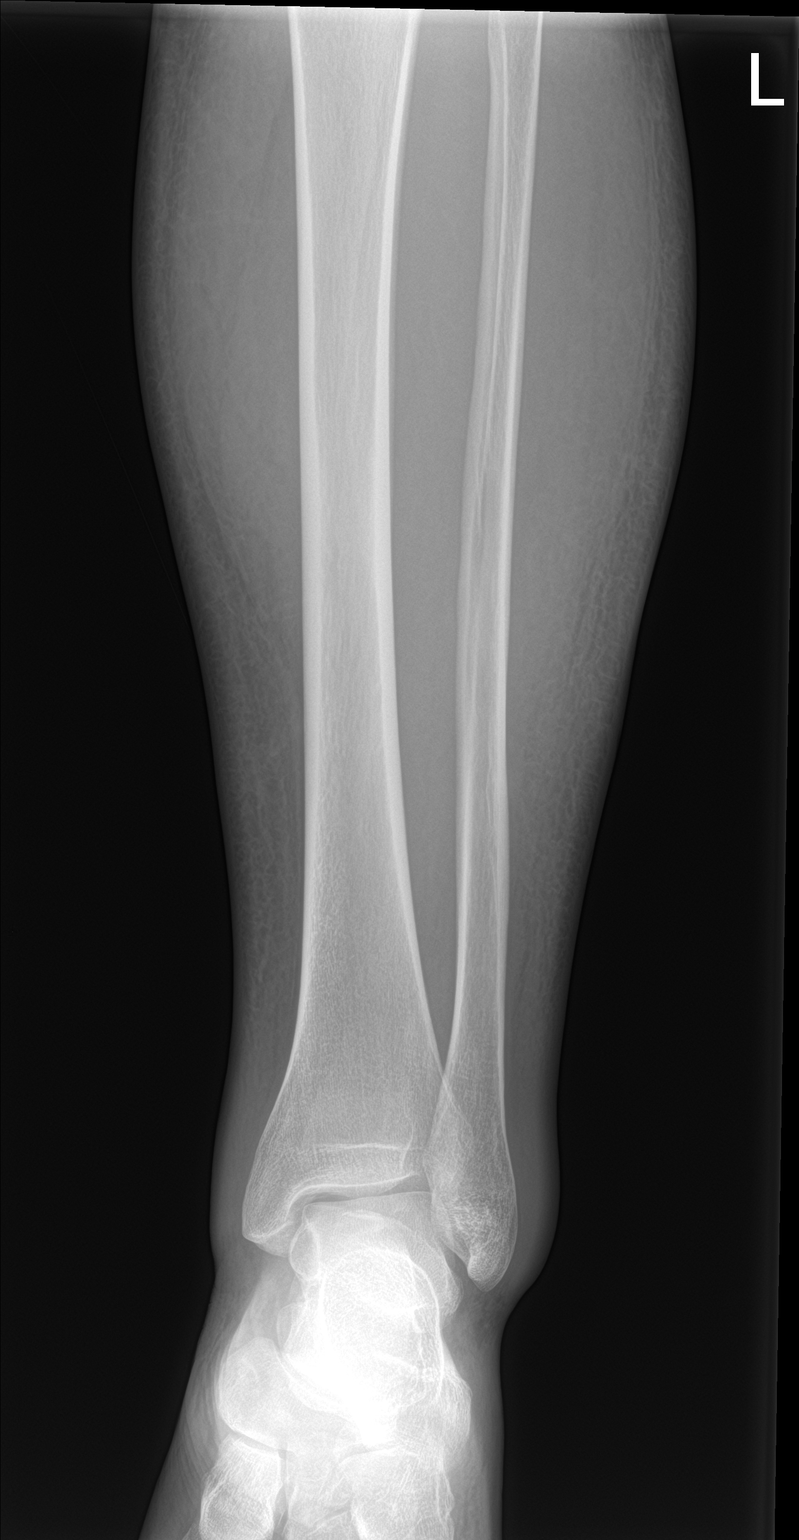

[tibia lat (1 of 2)]
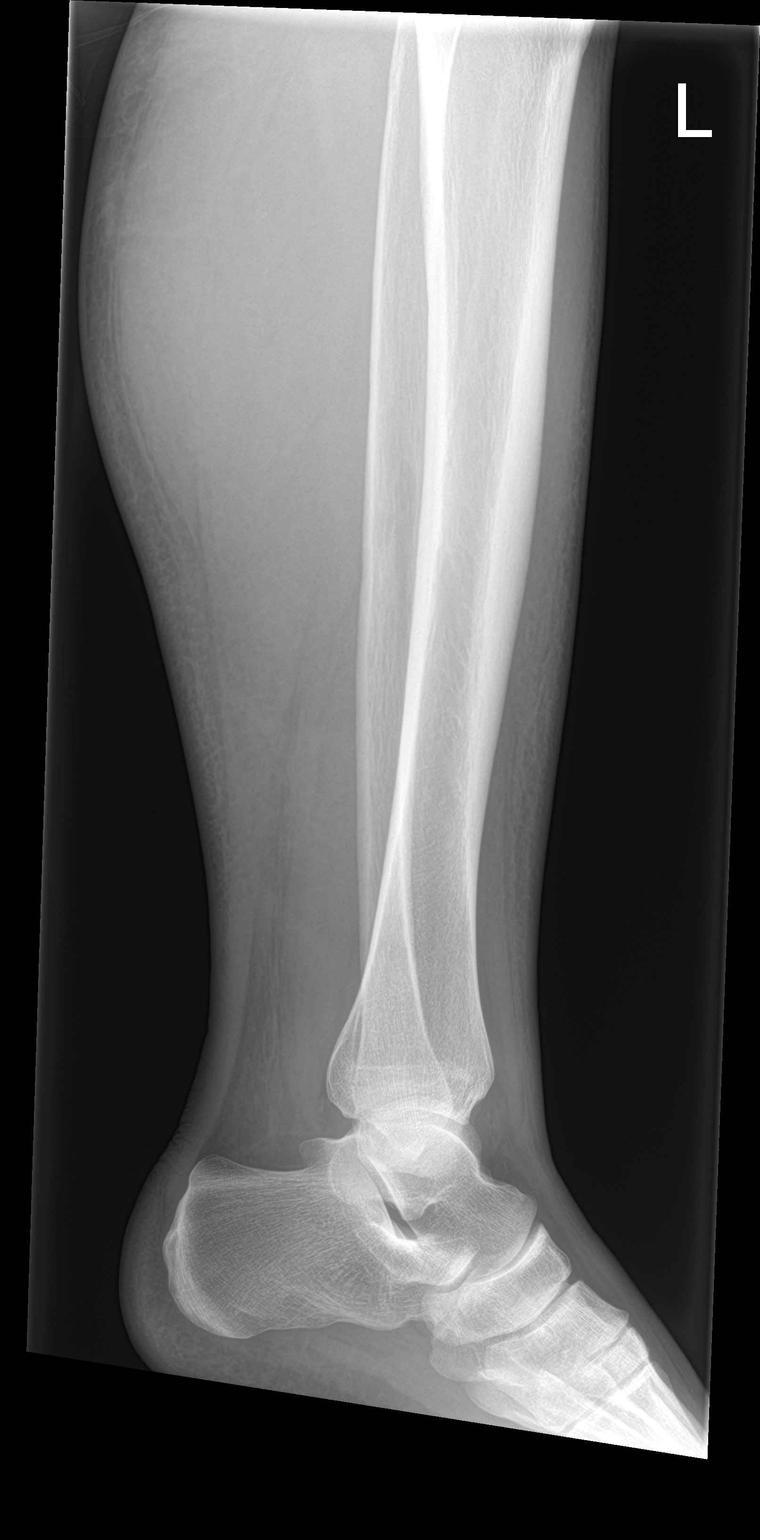

[tibia lat (2 of 2)]
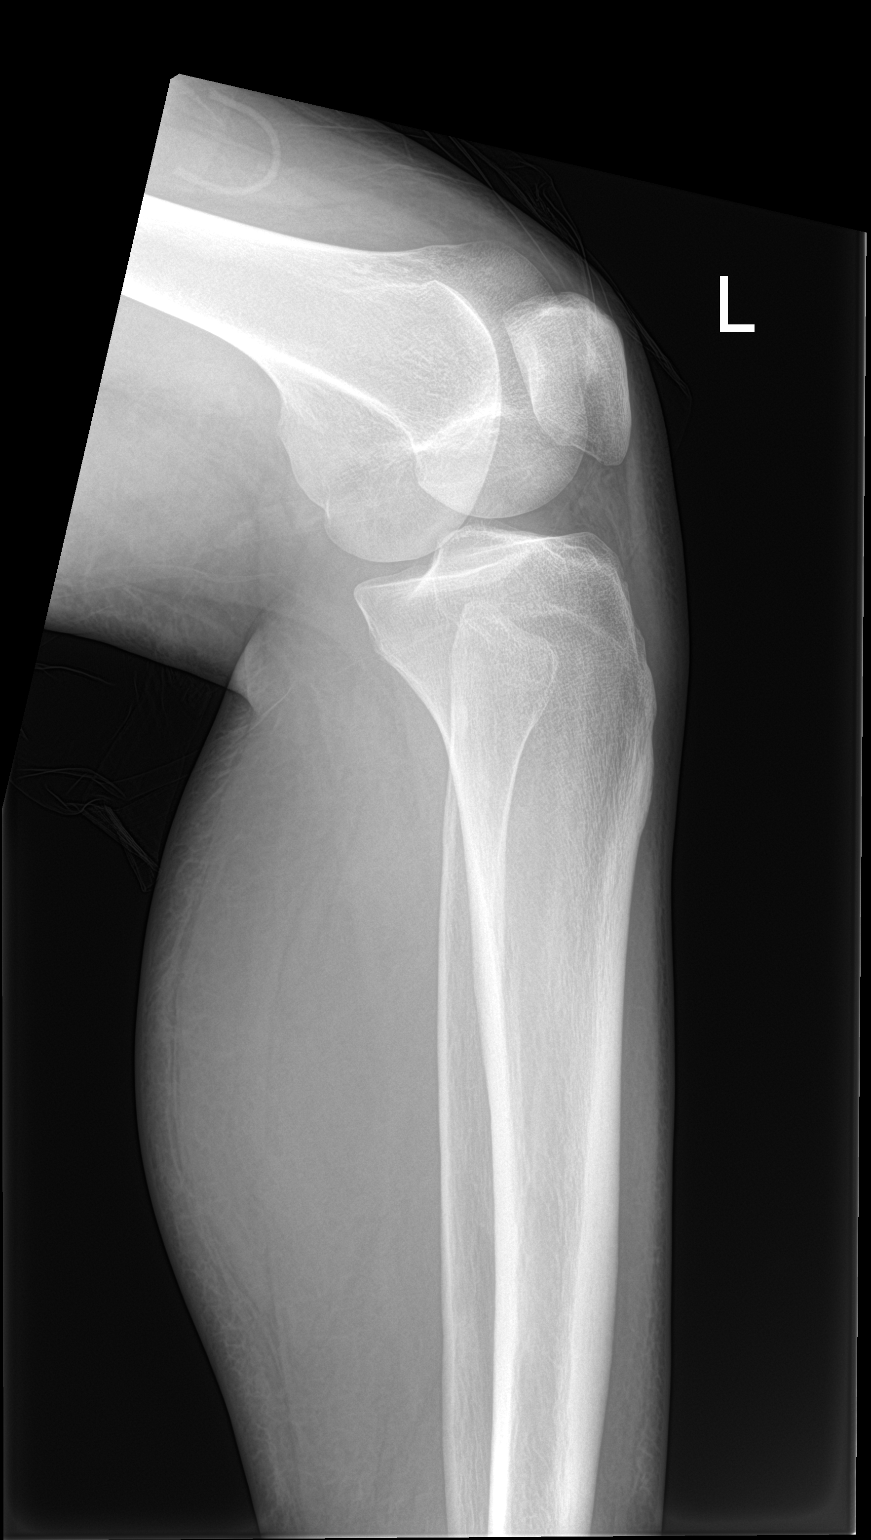

[4 of 4 positions shown; findings below may reference images not displayed]

FINDINGS: No fracture. Small bone island in the anterior proximal tibia. No
other bone lesions.

Knee and ankle joints are normally spaced and aligned. No
degenerative/arthropathic change.

Nonspecific diffuse subcutaneous soft tissue swelling.
IMPRESSION: 1. No fracture or joint abnormality.
2. Nonspecific subcutaneous soft tissue swelling.

## 2021-01-29 MED ORDER — ACETAMINOPHEN 325 MG PO TABS
650.0000 mg | ORAL_TABLET | Freq: Once | ORAL | Status: AC
  Administered 2021-01-29: 650 mg via ORAL
  Filled 2021-01-29: qty 2

## 2021-01-29 NOTE — Progress Notes (Signed)
Lower extremity venous LT study completed.  Preliminary results relayed to Asher Muir, RN.  See CV Proc for preliminary results report.   Jean Rosenthal, RDMS, RVT

## 2021-01-29 NOTE — ED Provider Notes (Signed)
[facility] EMERGENCY DEPARTMENT Provider Note   CSN: [id number] Arrival date & time: 01/29/21  6073     History Chief Complaint  Patient presents with   Leg Pain   Leg Swelling    Aaron Holder is a 25 y.o. male with past medical history significant for ADHD, bipolar 1 disorder, seizures.  HPI Patient presents to emergency room today with chief complaint of left leg swelling x3 days.  Patient admits to having history of DVT although is states he never took any blood thinners. It was noted in triage that patient was hard to arouse, he denies any drug use.  Triage RN documents that he falls asleep immediately after answering questions.  Patient tells me that he has had left leg pain and swelling x1 day.  He noticed it today and came straight to the emergency department for evaluation.  He states he was released from jail x4 days ago.  He denies any drug use since being released.  He states he is having constant pain in his left lower leg and noticed it was swollen today.  He describes the pain as burning sensation.  He rates the pain 7 out of 10 in severity.  He denies any medications for symptoms prior to arrival.  He denies any fall or injury to the leg.  Patient states he has not slept in a couple days because he is currently moving.  Denies any fever, chills, numbness tingling or weakness.   Past Medical History:  Diagnosis Date   ADHD (attention deficit hyperactivity disorder)    Bipolar 1 disorder (HCC)    Seizures (HCC)     There are no problems to display for this patient.   Past Surgical History:  Procedure Laterality Date   APPENDECTOMY     HERNIA REPAIR         No family history on file.  Social History   Tobacco Use   Smoking status: Never   Smokeless tobacco: Never  Substance Use Topics   Alcohol use: Yes   Drug use: Not Currently    Home Medications Prior to Admission medications   Medication Sig Start Date End Date Taking?  Authorizing Provider  cephALEXin (KEFLEX) 500 MG capsule Take 1 capsule (500 mg total) by mouth 4 (four) times daily. 03/20/17   Earley Favor, NP  HYDROcodone-acetaminophen (NORCO/VICODIN) 5-325 MG tablet Take 1 tablet by mouth every 6 (six) hours as needed for severe pain. 03/20/17   Earley Favor, NP  ibuprofen (ADVIL,MOTRIN) 800 MG tablet Take 1 tablet (800 mg total) by mouth 3 (three) times daily. 02/23/16   Dan Humphreys, MD  methocarbamol (ROBAXIN) 500 MG tablet Take 1 tablet (500 mg total) by mouth 2 (two) times daily. 05/13/20   Dartha Lodge, PA-C  mupirocin nasal ointment (BACTROBAN) 2 % Apply in each nostril daily 03/20/17   Earley Favor, NP  naloxone [facility]) nasal spray 4 mg/0.1 mL As needed for opiate overdose 05/11/20   Mesner, Barbara Cower, MD  naproxen (NAPROSYN) 500 MG tablet Take 1 tablet (500 mg total) by mouth 2 (two) times daily. 05/13/20   Dartha Lodge, PA-C  penicillin v potassium (VEETID) 500 MG tablet Take 1 tablet (500 mg total) by mouth 3 (three) times daily. 03/18/16   Felicie Morn, NP    Allergies    Patient has no known allergies.  Review of Systems   Review of Systems All other systems are reviewed and are negative for acute change except as noted  in the HPI.  Physical Exam Updated Vital Signs BP (!) 101/55   Pulse (!) 58   Temp 97.7 F (36.5 C)   Resp 14   SpO2 99%   Physical Exam Vitals and nursing note reviewed.  Constitutional:      General: He is not in acute distress.    Appearance: He is not ill-appearing.     Comments: Disheveled  HENT:     Head: Normocephalic and atraumatic.     Right Ear: Tympanic membrane and external ear normal.     Left Ear: Tympanic membrane and external ear normal.     Nose: Nose normal.     Mouth/Throat:     Mouth: Mucous membranes are moist.     Pharynx: Oropharynx is clear.  Eyes:     General: No scleral icterus.       Right eye: No discharge.        Left eye: No discharge.     Extraocular Movements: Extraocular  movements intact.     Conjunctiva/sclera: Conjunctivae normal.     Pupils: Pupils are equal, round, and reactive to light.  Neck:     Vascular: No JVD.  Cardiovascular:     Rate and Rhythm: Normal rate and regular rhythm.     Pulses: Normal pulses.          Radial pulses are 2+ on the right side and 2+ on the left side.       Dorsalis pedis pulses are 2+ on the right side and 2+ on the left side.     Heart sounds: Normal heart sounds.  Pulmonary:     Comments: Lungs clear to auscultation in all fields. Symmetric chest rise. No wheezing, rales, or rhonchi. Abdominal:     Comments: Abdomen is soft, non-distended, and non-tender in all quadrants. No rigidity, no guarding. No peritoneal signs.  Musculoskeletal:        General: Normal range of motion.     Cervical back: Normal range of motion.     Comments: Swelling to left lower extremity.  Tenderness palpation of left calf without any palpable cords.  Patient has scratches on his bilateral lower extremities without any signs of infection.  He has brisk cap refill in left toes and is able to wiggle them easily.  Full range of motion of left knee and ankle.  Skin:    General: Skin is warm and dry.     Capillary Refill: Capillary refill takes less than 2 seconds.     Comments: Equal tactile temperature in all extremities.  Neurological:     Mental Status: He is oriented to person, place, and time.     GCS: GCS eye subscore is 4. GCS verbal subscore is 5. GCS motor subscore is 6.     Comments: Fluent speech, no facial droop.  Psychiatric:        Behavior: Behavior normal.      ED Results / Procedures / Treatments   Labs (all labs ordered are listed, but only abnormal results are displayed) Labs Reviewed  BASIC METABOLIC PANEL - Abnormal; Notable for the following components:      Result Value   Glucose, Bld 110 (*)    All other components within normal limits  CBC - Abnormal; Notable for the following components:   Hemoglobin 12.6  (*)    All other components within normal limits    EKG None  Radiology DG Tibia/Fibula Left  Result Date: 01/29/2021 CLINICAL DATA:  Patient complains  of left lower swelling x 3 days with pain. Hx DVT; never took blood thinners. Patient denies SOB. EXAM: LEFT TIBIA AND FIBULA - 2 VIEW COMPARISON:  None. FINDINGS: No fracture. Small bone island in the anterior proximal tibia. No other bone lesions. Knee and ankle joints are normally spaced and aligned. No degenerative/arthropathic change. Nonspecific diffuse subcutaneous soft tissue swelling. IMPRESSION: 1. No fracture or joint abnormality. 2. Nonspecific subcutaneous soft tissue swelling. Electronically Signed   By: Amie Portland M.D.   On: 01/29/2021 11:19   DG Ankle Complete Left  Result Date: 01/29/2021 CLINICAL DATA:  Patient complains of left lower swelling x 3 days with pain. Hx DVT; never took blood thinners. Patient denies SOB. EXAM: LEFT ANKLE COMPLETE - 3+ VIEW COMPARISON:  None. FINDINGS: No fracture or bone lesion. Ankle joint normally spaced and aligned. No degenerative/arthropathic change. Nonspecific diffuse soft tissue swelling. IMPRESSION: 1. No fracture or ankle joint abnormality. 2. Nonspecific soft tissue swelling. Electronically Signed   By: Amie Portland M.D.   On: 01/29/2021 11:20   VAS [place] LOWER EXTREMITY VENOUS (DVT) ([facility] [place] 7a-7p)  Result Date: 01/29/2021  Lower Venous DVT Study Patient Name:  ATIF CHAPPLE  Date of Exam:   01/29/2021 Medical Rec #: [id number]          Accession #:    [id number] Date of Birth: Jan 03, 1996          Patient Gender: M Patient Age:   25Y Exam Location:  [facility] Procedure:      VAS [place] LOWER EXTREMITY VENOUS (DVT) Referring Phys: [id number] ELIZABETH REES --------------------------------------------------------------------------------  Indications: Swelling x3 days. Patient endorses remote history of DVT.  Comparison Study: 05-13-2020 Prior LT lower extremity venous was negative for                    DVT. Performing Technologist: Jean Rosenthal RDMS,RVT  Examination Guidelines: A complete evaluation includes B-mode imaging, spectral Doppler, color Doppler, and power Doppler as needed of all accessible portions of each vessel. Bilateral testing is considered an integral part of a complete examination. Limited examinations for reoccurring indications may be performed as noted. The reflux portion of the exam is performed with the patient in reverse Trendelenburg.  +-----+---------------+---------+-----------+----------+--------------+ RIGHTCompressibilityPhasicitySpontaneityPropertiesThrombus Aging +-----+---------------+---------+-----------+----------+--------------+ CFV  Full           Yes      Yes                                 +-----+---------------+---------+-----------+----------+--------------+   +---------+---------------+---------+-----------+----------+--------------+ LEFT     CompressibilityPhasicitySpontaneityPropertiesThrombus Aging +---------+---------------+---------+-----------+----------+--------------+ CFV      Full           Yes      Yes                                 +---------+---------------+---------+-----------+----------+--------------+ SFJ      Full                                                        +---------+---------------+---------+-----------+----------+--------------+ FV Prox  Full                                                        +---------+---------------+---------+-----------+----------+--------------+  FV Mid   Full                                                        +---------+---------------+---------+-----------+----------+--------------+ FV DistalFull                                                        +---------+---------------+---------+-----------+----------+--------------+ PFV      Full                                                         +---------+---------------+---------+-----------+----------+--------------+ POP      Full           Yes      Yes                                 +---------+---------------+---------+-----------+----------+--------------+ PTV      Full                                                        +---------+---------------+---------+-----------+----------+--------------+ PERO     Full                                                        +---------+---------------+---------+-----------+----------+--------------+ Gastroc  Full                                                        +---------+---------------+---------+-----------+----------+--------------+     Summary: RIGHT: - No evidence of common femoral vein obstruction.  LEFT: - There is no evidence of deep vein thrombosis in the lower extremity.  - No cystic structure found in the popliteal fossa.  *See table(s) above for measurements and observations.    Preliminary     Procedures Procedures   Medications Ordered in ED Medications  acetaminophen (TYLENOL) tablet 650 mg (650 mg Oral Given 01/29/21 1302)    ED Course  I have reviewed the triage vital signs and the nursing notes.  Pertinent labs & imaging results that were available during my care of the patient were reviewed by me and considered in my medical decision making (see chart for details).    MDM Rules/Calculators/A&P                          Patient had documented oxygen saturation of 87% on room air in triage.  Patient placed in exam room and vitals were rechecked.  He is oxygen saturation of 99% on room air.  Unsure if hypoxia earlier was real.  Chart review shows patient has been seen in the ED before for accidental opiate overdose and admitted to using heroin.  Also reported he had history of DVT however never took blood thinners for it.  I do not see this in his chart anywhere. On my exam patient is resting comfortably.  He is alert and oriented.  Able to  engage in conversation.  He has swelling of his left lower leg.  He is full range of motion of left lower extremity and it is neurovascularly intact.  Compartments are soft.  He does have tenderness palpation of left calf without any palpable cords.  He has brisk cap refill. Work-up was initiated in triage.  Ultrasound is negative for DVT.  CBC and BMP overall unremarkable.  I added x-rays of patient's left ankle and tib-fib.  They are without signs of fractures or dislocation.  I viewed imaging and agree with radiologist impression.  Patient is able to ambulate without pain.  There are no indications of infection or septic joint on exam.  Do not feel antibiotics are needed.  Patient given Tylenol for pain.  Patient has not had any further episodes of hypoxia besides what was initially documented in triage.  Discussed all results with patient.  He states he was only here to be checked out for a blood clot.  Recommend he follow-up with PCP if symptoms continue.  Discussed elevation to help with swelling as well as Tylenol and ibuprofen for pain at home. The patient appears reasonably screened and/or stabilized for discharge and I doubt any other medical condition or other [facility] requiring further screening, evaluation, or treatment in the ED at this time prior to discharge. The patient is safe for discharge with strict return precautions discussed.    Portions of this note were generated with Scientist, clinical (histocompatibility and immunogenetics). Dictation errors may occur despite best attempts at proofreading.  Final Clinical Impression(s) / ED Diagnoses Final diagnoses:  Leg swelling    Rx / DC Orders ED Discharge Orders     None        Shanon Ace, PA-C 01/29/21 1303    Derwood Kaplan, MD 01/30/21 [phone]

## 2021-01-29 NOTE — ED Triage Notes (Signed)
Patient complains of left lower swelling x 3 days with pain. Reports that he has had DVT before and never took blood thinners. Patient denies SOB. Patient hard to arouse in triage but denies drug use. Sleeps as soon as you stop asking questions

## 2021-01-29 NOTE — ED Notes (Signed)
Pt d/c home per MD order. Discharge summary reviewed, pt verbalizes understanding. Ambulatory off unit. No s/s of acute distress noted at discharge.  °

## 2021-01-29 NOTE — Discharge Instructions (Addendum)
Ultrasound did not show signs of a blood clot.  Elevate your leg to help with pain and swelling. Take tylenol and ibuprofen for pain at home. Follow up with primary care provider if symptoms do not improve.

## 2021-01-29 NOTE — ED Notes (Signed)
Patient is resting comfortably. 

## 2021-09-10 ENCOUNTER — Other Ambulatory Visit: Payer: Self-pay

## 2021-09-10 ENCOUNTER — Emergency Department (HOSPITAL_COMMUNITY): Admission: RE | Admit: 2021-09-10 | Payer: Self-pay | Source: Ambulatory Visit

## 2021-09-10 ENCOUNTER — Encounter (HOSPITAL_COMMUNITY): Payer: Self-pay | Admitting: Emergency Medicine

## 2021-09-10 ENCOUNTER — Emergency Department (HOSPITAL_COMMUNITY)
Admission: EM | Admit: 2021-09-10 | Discharge: 2021-09-10 | Disposition: A | Discharge status: Home / Self Care | Payer: Self-pay | Attending: Emergency Medicine | Admitting: Emergency Medicine

## 2021-09-10 DIAGNOSIS — M7989 Other specified soft tissue disorders: Secondary | ICD-10-CM | POA: Insufficient documentation

## 2021-09-10 DIAGNOSIS — M79662 Pain in left lower leg: Secondary | ICD-10-CM

## 2021-09-10 DIAGNOSIS — Z79899 Other long term (current) drug therapy: Secondary | ICD-10-CM | POA: Insufficient documentation

## 2021-09-10 MED ORDER — RIVAROXABAN 15 MG PO TABS
15.0000 mg | ORAL_TABLET | Freq: Once | ORAL | Status: AC
  Administered 2021-09-10: 15 mg via ORAL
  Filled 2021-09-10: qty 1

## 2021-09-10 MED ORDER — ACETAMINOPHEN 325 MG PO TABS
650.0000 mg | ORAL_TABLET | Freq: Once | ORAL | Status: AC
  Administered 2021-09-10: 650 mg via ORAL
  Filled 2021-09-10: qty 2

## 2021-09-10 NOTE — ED Provider Notes (Signed)
[facility] EMERGENCY DEPARTMENT Provider Note   CSN: [id number] Arrival date & time: 09/10/21  0041     History  Chief Complaint  Patient presents with   Leg Swelling    Aaron Holder is a 26 y.o. male.  The history is provided by the patient.  He has history of bipolar disorder, seizure disorder, and comes in because of pain and swelling in his left lower leg which started this morning.  He denies any trauma.  Pain is rated at 5/10.  It is worse with ambulation.  He denies any chest pain or difficulty breathing.  He does have history of DVT when he was a teenager.   Home Medications Prior to Admission medications   Medication Sig Start Date End Date Taking? Authorizing Provider  cephALEXin (KEFLEX) 500 MG capsule Take 1 capsule (500 mg total) by mouth 4 (four) times daily. 03/20/17   Earley Favor, NP  HYDROcodone-acetaminophen (NORCO/VICODIN) 5-325 MG tablet Take 1 tablet by mouth every 6 (six) hours as needed for severe pain. 03/20/17   Earley Favor, NP  ibuprofen (ADVIL,MOTRIN) 800 MG tablet Take 1 tablet (800 mg total) by mouth 3 (three) times daily. 02/23/16   Dan Humphreys, MD  methocarbamol (ROBAXIN) 500 MG tablet Take 1 tablet (500 mg total) by mouth 2 (two) times daily. 05/13/20   Dartha Lodge, PA-C  mupirocin nasal ointment (BACTROBAN) 2 % Apply in each nostril daily 03/20/17   Earley Favor, NP  naloxone [facility]) nasal spray 4 mg/0.1 mL As needed for opiate overdose 05/11/20   Mesner, Barbara Cower, MD  naproxen (NAPROSYN) 500 MG tablet Take 1 tablet (500 mg total) by mouth 2 (two) times daily. 05/13/20   Dartha Lodge, PA-C  penicillin v potassium (VEETID) 500 MG tablet Take 1 tablet (500 mg total) by mouth 3 (three) times daily. 03/18/16   Felicie Morn, NP      Allergies    Patient has no known allergies.    Review of Systems   Review of Systems  All other systems reviewed and are negative.  Physical Exam Updated Vital Signs BP 125/79    Pulse 83     Temp 98.2 F (36.8 C) (Oral)    Resp 19    SpO2 98%  Physical Exam Vitals and nursing note reviewed.  27 year old male, resting comfortably and in no acute distress. Vital signs are normal. Oxygen saturation is 98%, which is normal. Head is normocephalic and atraumatic. PERRLA, EOMI. Oropharynx is clear. Neck is nontender and supple without adenopathy or JVD. Back is nontender and there is no CVA tenderness. Lungs are clear without rales, wheezes, or rhonchi. Chest is nontender. Heart has regular rate and rhythm without murmur. Abdomen is soft, flat, nontender. Extremities: There is moderate swelling of the left lower leg with left calf circumference 4 cm greater than right calf circumference.  There is 2+ pitting edema on the left.  There is mild tenderness over the proximal portion of the left calf.  There are no palpable cords.  Denna Haggard' sign is negative. Skin is warm and dry without rash. Neurologic: Mental status is normal, cranial nerves are intact, moves all extremities equally.  ED Results / Procedures / Treatments    Procedures Procedures    Medications Ordered in ED Medications  Rivaroxaban (XARELTO) tablet 15 mg (has no administration in time range)  acetaminophen (TYLENOL) tablet 650 mg (has no administration in time range)    ED Course/ Medical Decision Making/ A&P  Medical Decision Making  Left leg swelling which is most likely a ruptured Baker's cyst.  Consider possibility of DVT.  Old records are reviewed, he has to vascular ultrasounds done in our system showing no evidence of DVT.  However, with history of DVT and unilateral swelling, will treat for possible DVT.  He is given a dose of rivaroxaban and will be brought back for another vascular ultrasound.  Advised on appropriate treatment of Baker's cyst if vascular ultrasound is negative.        Final Clinical Impression(s) / ED Diagnoses Final diagnoses:  Pain and swelling of  left lower leg    Rx / DC Orders ED Discharge Orders     None         Dione Booze, MD 09/10/21 0301

## 2021-09-10 NOTE — Discharge Instructions (Signed)
Please return in the morning for a vascular ultrasound to make sure that you do not have another blood clot in your leg.  However, I suspect that your pain and the swelling is from a ruptured Baker's cyst.  If that is the case, you need to apply ice, keep your leg elevated is much as possible, and take over-the-counter anti-inflammatory medication like ibuprofen and naproxen.

## 2021-09-10 NOTE — ED Triage Notes (Addendum)
Pt c/o left leg swelling and pain, denies injury. Hx DVT.

## 2021-10-08 ENCOUNTER — Emergency Department (HOSPITAL_COMMUNITY): Payer: Self-pay

## 2021-10-08 ENCOUNTER — Other Ambulatory Visit: Payer: Self-pay

## 2021-10-08 ENCOUNTER — Emergency Department (HOSPITAL_COMMUNITY)
Admission: EM | Admit: 2021-10-08 | Discharge: 2021-10-09 | Disposition: A | Discharge status: Home / Self Care | Payer: Self-pay | Attending: Emergency Medicine | Admitting: Emergency Medicine

## 2021-10-08 ENCOUNTER — Encounter (HOSPITAL_COMMUNITY): Payer: Self-pay

## 2021-10-08 DIAGNOSIS — K089 Disorder of teeth and supporting structures, unspecified: Secondary | ICD-10-CM | POA: Insufficient documentation

## 2021-10-08 DIAGNOSIS — S31111A Laceration without foreign body of abdominal wall, left upper quadrant without penetration into peritoneal cavity, initial encounter: Secondary | ICD-10-CM | POA: Insufficient documentation

## 2021-10-08 DIAGNOSIS — T1490XA Injury, unspecified, initial encounter: Secondary | ICD-10-CM

## 2021-10-08 DIAGNOSIS — X58XXXA Exposure to other specified factors, initial encounter: Secondary | ICD-10-CM | POA: Insufficient documentation

## 2021-10-08 DIAGNOSIS — Y9301 Activity, walking, marching and hiking: Secondary | ICD-10-CM | POA: Insufficient documentation

## 2021-10-08 DIAGNOSIS — S41112A Laceration without foreign body of left upper arm, initial encounter: Secondary | ICD-10-CM | POA: Insufficient documentation

## 2021-10-08 DIAGNOSIS — R000 Tachycardia, unspecified: Secondary | ICD-10-CM | POA: Insufficient documentation

## 2021-10-08 DIAGNOSIS — Z20822 Contact with and (suspected) exposure to covid-19: Secondary | ICD-10-CM | POA: Insufficient documentation

## 2021-10-08 DIAGNOSIS — S21212A Laceration without foreign body of left back wall of thorax without penetration into thoracic cavity, initial encounter: Secondary | ICD-10-CM | POA: Insufficient documentation

## 2021-10-08 DIAGNOSIS — T148XXA Other injury of unspecified body region, initial encounter: Secondary | ICD-10-CM

## 2021-10-08 LAB — I-STAT CHEM 8, ED
BUN: 17 mg/dL (ref 6–20)
Calcium, Ion: 1.12 mmol/L — ABNORMAL LOW (ref 1.15–1.40)
Chloride: 99 mmol/L (ref 98–111)
Creatinine, Ser: 1 mg/dL (ref 0.61–1.24)
Glucose, Bld: 107 mg/dL — ABNORMAL HIGH (ref 70–99)
HCT: 43 % (ref 39.0–52.0)
Hemoglobin: 14.6 g/dL (ref 13.0–17.0)
Potassium: 4 mmol/L (ref 3.5–5.1)
Sodium: 138 mmol/L (ref 135–145)
TCO2: 27 mmol/L (ref 22–32)

## 2021-10-08 LAB — CBC
HCT: 41.3 % (ref 39.0–52.0)
Hemoglobin: 13.2 g/dL (ref 13.0–17.0)
MCH: 28.1 pg (ref 26.0–34.0)
MCHC: 32 g/dL (ref 30.0–36.0)
MCV: 87.9 fL (ref 80.0–100.0)
Platelets: 269 10*3/uL (ref 150–400)
RBC: 4.7 MIL/uL (ref 4.22–5.81)
RDW: 13.7 % (ref 11.5–15.5)
WBC: 8.4 10*3/uL (ref 4.0–10.5)
nRBC: 0 % (ref 0.0–0.2)

## 2021-10-08 LAB — PROTIME-INR
INR: 1 (ref 0.8–1.2)
Prothrombin Time: 12.8 seconds (ref 11.4–15.2)

## 2021-10-08 LAB — SAMPLE TO BLOOD BANK
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
## 2021-10-08 MED ORDER — CEFAZOLIN SODIUM-DEXTROSE 2-4 GM/100ML-% IV SOLN
2.0000 g | Freq: Once | INTRAVENOUS | Status: AC
  Administered 2021-10-08: 2 g via INTRAVENOUS

## 2021-10-08 MED ORDER — FENTANYL CITRATE (PF) 100 MCG/2ML IJ SOLN
INTRAMUSCULAR | Status: AC
  Filled 2021-10-08: qty 2

## 2021-10-08 MED ORDER — FENTANYL CITRATE PF 50 MCG/ML IJ SOSY
50.0000 ug | PREFILLED_SYRINGE | Freq: Once | INTRAMUSCULAR | Status: AC
  Administered 2021-10-08: 50 ug via INTRAVENOUS

## 2021-10-08 MED ORDER — IOHEXOL 300 MG/ML  SOLN
100.0000 mL | Freq: Once | INTRAMUSCULAR | Status: AC | PRN
Start: 2021-10-08 — End: 2021-10-08
  Administered 2021-10-08: 100 mL via INTRAVENOUS

## 2021-10-08 NOTE — ED Notes (Signed)
Patient transported to CT 
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
## 2021-10-08 NOTE — Progress Notes (Signed)
Orthopedic Tech Progress Note ?Patient Details:  ?Aaron Holder ?Aug 02, 1995 ?[id number] ? ?Patient ID: Aaron Holder, male   DOB: 1996-01-25, 26 y.o.   MRN: [id number] ?I attended trauma page. ?Aaron Holder ?10/08/2021, 11:37 PM ? ?

## 2021-10-09 ENCOUNTER — Encounter (HOSPITAL_COMMUNITY): Payer: Self-pay

## 2021-10-09 ENCOUNTER — Other Ambulatory Visit: Payer: Self-pay

## 2021-10-09 LAB — COMPREHENSIVE METABOLIC PANEL
ALT: 26 U/L (ref 0–44)
AST: 31 U/L (ref 15–41)
Albumin: 4 g/dL (ref 3.5–5.0)
Alkaline Phosphatase: 99 U/L (ref 38–126)
Anion gap: 13 (ref 5–15)
BUN: 13 mg/dL (ref 6–20)
CO2: 26 mmol/L (ref 22–32)
Calcium: 9.5 mg/dL (ref 8.9–10.3)
Chloride: 98 mmol/L (ref 98–111)
Creatinine, Ser: 1.09 mg/dL (ref 0.61–1.24)
GFR, Estimated: >60 mL/min (ref >60)
Glucose, Bld: 113 mg/dL — ABNORMAL HIGH (ref 70–99)
Potassium: 4 mmol/L (ref 3.5–5.1)
Sodium: 137 mmol/L (ref 135–145)
Total Bilirubin: 0.2 mg/dL — ABNORMAL LOW (ref 0.3–1.2)
Total Protein: 7.9 g/dL (ref 6.5–8.1)

## 2021-10-09 LAB — RESP PANEL BY RT-PCR (FLU A&B, COVID) ARPGX2
Influenza A by PCR: NEGATIVE
Influenza B by PCR: NEGATIVE
SARS Coronavirus 2 by RT PCR: NEGATIVE

## 2021-10-09 LAB — LACTIC ACID, PLASMA: Lactic Acid, Venous: 2.5 mmol/L (ref 0.5–1.9)

## 2021-10-09 LAB — ETHANOL: Alcohol, Ethyl (B): <10 mg/dL (ref <10)

## 2021-10-09 MED ORDER — TETANUS-DIPHTH-ACELL PERTUSSIS 5-2.5-18.5 LF-MCG/0.5 IM SUSY: 0.5000 mL | PREFILLED_SYRINGE | Freq: Once | INTRAMUSCULAR | Status: DC
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
## 2021-10-09 NOTE — Progress Notes (Signed)
?   10/09/21 0005  ?Clinical Encounter Type  ?Visited With Patient  ?Visit Type Initial;Trauma  ?Referral From Nurse  ?Consult/Referral To Chaplain  ? ?Chaplain Jorene Guest responded to page. The patient said he was in a lot of pain. There is no support person present. The patient said he family knows he is at [facility] Prayer offered. Patient expressed appreciation of visit.This note was prepared by Jeanine Luz, M.Div..  For questions please contact by phone [phone].   ?

## 2023-04-25 ENCOUNTER — Other Ambulatory Visit: Payer: Self-pay

## 2023-04-25 ENCOUNTER — Encounter (HOSPITAL_COMMUNITY): Payer: Self-pay

## 2023-04-25 ENCOUNTER — Emergency Department (HOSPITAL_COMMUNITY)
Admission: EM | Admit: 2023-04-25 | Discharge: 2023-04-25 | Disposition: A | Discharge status: Home / Self Care | Payer: MEDICAID | Attending: Emergency Medicine | Admitting: Emergency Medicine

## 2023-04-25 DIAGNOSIS — I959 Hypotension, unspecified: Secondary | ICD-10-CM | POA: Insufficient documentation

## 2023-04-25 DIAGNOSIS — R001 Bradycardia, unspecified: Secondary | ICD-10-CM | POA: Insufficient documentation

## 2023-04-25 DIAGNOSIS — T401X1A Poisoning by heroin, accidental (unintentional), initial encounter: Secondary | ICD-10-CM | POA: Insufficient documentation

## 2023-04-25 LAB — BASIC METABOLIC PANEL
Anion gap: 9 (ref 5–15)
BUN: 12 mg/dL (ref 6–20)
CO2: 24 mmol/L (ref 22–32)
Calcium: 8.6 mg/dL — ABNORMAL LOW (ref 8.9–10.3)
Chloride: 103 mmol/L (ref 98–111)
Creatinine, Ser: 0.85 mg/dL (ref 0.61–1.24)
GFR, Estimated: >60 mL/min (ref >60)
Glucose, Bld: 145 mg/dL — ABNORMAL HIGH (ref 70–99)
Potassium: 4.1 mmol/L (ref 3.5–5.1)
Sodium: 136 mmol/L (ref 135–145)

## 2023-04-25 LAB — ETHANOL: Alcohol, Ethyl (B): <10 mg/dL (ref <10)

## 2023-04-25 LAB — CBC
HCT: 38.4 % — ABNORMAL LOW (ref 39.0–52.0)
Hemoglobin: 12.7 g/dL — ABNORMAL LOW (ref 13.0–17.0)
MCH: 28.5 pg (ref 26.0–34.0)
MCHC: 33.1 g/dL (ref 30.0–36.0)
MCV: 86.3 fL (ref 80.0–100.0)
Platelets: 230 10*3/uL (ref 150–400)
RBC: 4.45 MIL/uL (ref 4.22–5.81)
RDW: 14 % (ref 11.5–15.5)
WBC: 4.8 10*3/uL (ref 4.0–10.5)
nRBC: 0 % (ref 0.0–0.2)

## 2023-04-25 MED ORDER — LACTATED RINGERS IV BOLUS
1000.0000 mL | Freq: Once | INTRAVENOUS | Status: AC
Start: 2023-04-25 — End: 2023-04-25
  Administered 2023-04-25: 1000 mL via INTRAVENOUS

## 2023-04-25 MED ORDER — NALOXONE HCL 0.4 MG/ML IJ SOLN
0.2000 mg | Freq: Once | INTRAMUSCULAR | Status: AC
  Administered 2023-04-25: 0.2 mg via INTRAVENOUS
  Filled 2023-04-25: qty 1

## 2023-04-25 MED ORDER — LACTATED RINGERS IV BOLUS
1000.0000 mL | Freq: Once | INTRAVENOUS | Status: AC
  Administered 2023-04-25: 1000 mL via INTRAVENOUS

## 2023-04-25 NOTE — ED Provider Notes (Signed)
[place] [facility]GENC[facility] DEPARTMENT AT [facility] Provider Note   CSN: [id number] Arrival date & time: 04/25/23  1534     History {Add pertinent medical, surgical, social history, OB history to HPI:1} Chief Complaint  Patient presents with   Drug Overdose    Aaron Holder is a 27 y.o. male.  27 year old male with a history of opioid abuse who presents emergency department after overdose.  Patient reports that at approximately 3 PM today he took what he thought was fentanyl and xylazine.  Accidentally took too much.  Says he also took a Xanax earlier.  EMS was called due to overdose.  Had not been given Narcan.  Says that this was not an attempt to end his life and denies any suicidal ideation at this time.  Given 500 mL of fluid and route due to hypotension.       Home Medications Prior to Admission medications   Medication Sig Start Date End Date Taking? Authorizing Provider  ibuprofen (ADVIL,MOTRIN) 800 MG tablet Take 1 tablet (800 mg total) by mouth 3 (three) times daily. 02/23/16   Dan Humphreys, MD  methocarbamol (ROBAXIN) 500 MG tablet Take 1 tablet (500 mg total) by mouth 2 (two) times daily. 05/13/20   Dartha Lodge, PA-C  mupirocin nasal ointment (BACTROBAN) 2 % Apply in each nostril daily 03/20/17   Earley Favor, NP  naloxone [facility]) nasal spray 4 mg/0.1 mL As needed for opiate overdose 05/11/20   Mesner, Barbara Cower, MD      Allergies    Patient has no known allergies.    Review of Systems   Review of Systems  Physical Exam Updated Vital Signs BP (!) 90/48 (BP Location: Right Arm)   Pulse 62   Temp 97.7 F (36.5 C) (Oral)   Resp 16   Ht 5\' 7"  (1.702 m)   Wt 72.6 kg   SpO2 99%   BMI 25.06 kg/m  Physical Exam Vitals and nursing note reviewed.  Constitutional:      General: He is not in acute distress.    Appearance: He is well-developed.     Comments: Drowsy but arouses to voice and is alert and oriented  HENT:     Head: Normocephalic and  atraumatic.     Right Ear: External ear normal.     Left Ear: External ear normal.     Nose: Nose normal.  Eyes:     Extraocular Movements: Extraocular movements intact.     Conjunctiva/sclera: Conjunctivae normal.     Pupils: Pupils are equal, round, and reactive to light.     Comments: Pinpoint pupils bilaterally  Cardiovascular:     Rate and Rhythm: Normal rate and regular rhythm.     Heart sounds: Normal heart sounds. No murmur heard. Pulmonary:     Effort: Pulmonary effort is normal. No respiratory distress.     Breath sounds: Normal breath sounds.     Comments: Satting well on room air Musculoskeletal:     Cervical back: Normal range of motion and neck supple.  Skin:    General: Skin is warm and dry.  Neurological:     Mental Status: Mental status is at baseline.  Psychiatric:        Mood and Affect: Mood normal.        Behavior: Behavior normal.     ED Results / Procedures / Treatments   Labs (all labs ordered are listed, but only abnormal results are displayed) Labs Reviewed  ETHANOL  CBC  BASIC METABOLIC PANEL  RAPID URINE DRUG SCREEN, HOSP PERFORMED    EKG None  Radiology No results found.  Procedures Procedures  {Document cardiac monitor, telemetry assessment procedure when appropriate:1}  Medications Ordered in ED Medications  lactated ringers bolus 1,000 mL (has no administration in time range)    ED Course/ Medical Decision Making/ A&P   {   Click here for ABCD2, HEART and other calculatorsREFRESH Note before signing :1}                              Medical Decision Making Amount and/or Complexity of Data Reviewed Labs: ordered.   ***  {Document critical care time when appropriate:1} {Document review of labs and clinical decision tools ie heart score, Chads2Vasc2 etc:1}  {Document your independent review of radiology images, and any outside records:1} {Document your discussion with family members, caretakers, and with  consultants:1} {Document social determinants of health affecting pt's care:1} {Document your decision making why or why not admission, treatments were needed:1} Final Clinical Impression(s) / ED Diagnoses Final diagnoses:  None    Rx / DC Orders ED Discharge Orders     None

## 2023-04-25 NOTE — ED Notes (Signed)
Date: 04/25/2023 Patient: Aaron Holder Admitted: 04/25/2023  3:35 PM Attending Provider: Rondel Baton, MD  Aaron Holder or his authorized caregiver has made the decision for the patient to leave the emergency department against the advice of Rondel Baton, MD.  He or his authorized caregiver has been informed and understands the inherent risks, including death.  He or his authorized caregiver has decided to accept the responsibility for this decision. Aaron Holder and all necessary parties have been advised that he may return for further evaluation or treatment. His condition at time of discharge was Fair.  Aaron Holder had current vital signs as follows:  Blood pressure 100/67, pulse (!) 51, temperature 97.7 F (36.5 C), temperature source Oral, resp. rate 15, height 5\' 7"  (1.702 m), weight 72.6 kg, SpO2 98%.   Aaron Holder or his authorized caregiver has not signed the Leaving Against Medical Advice form prior to leaving the department.  [employer] 04/25/2023

## 2023-04-25 NOTE — ED Notes (Signed)
Patient adamant about leaving. Spoke with Eloise Harman MD. Patient A&Ox4, drinking water, eating sandwich. Patient not dizzy when standing. Patient leaving AMA. Discussed with patient the risk of leaving, including worsening of status or death.

## 2023-04-25 NOTE — ED Triage Notes (Signed)
EMS called by bystander, patient found unresponsive at the [facility]. Upon EMS arrival patient responsive to verbal stimuli, but would fall asleep mid sentence. Patient endorses doing heroin. EMS reports some hypotension when sleeping 88/46.

## 2024-05-24 ENCOUNTER — Ambulatory Visit: Payer: MEDICAID | Admitting: Internal Medicine
